# Patient Record
Sex: Female | Born: 1990 | Race: White | Hispanic: No | Marital: Married | Smoking: Current every day smoker
Health system: Southern US, Community
[De-identification: ages and names within clinical notes are randomized; demographics above are authoritative.]

## PROBLEM LIST (undated history)

## (undated) ENCOUNTER — Inpatient Hospital Stay (HOSPITAL_COMMUNITY): Payer: Self-pay

## (undated) DIAGNOSIS — F4310 Post-traumatic stress disorder, unspecified: Secondary | ICD-10-CM

## (undated) DIAGNOSIS — N926 Irregular menstruation, unspecified: Secondary | ICD-10-CM

## (undated) DIAGNOSIS — N289 Disorder of kidney and ureter, unspecified: Secondary | ICD-10-CM

## (undated) DIAGNOSIS — F329 Major depressive disorder, single episode, unspecified: Secondary | ICD-10-CM

## (undated) DIAGNOSIS — Z8619 Personal history of other infectious and parasitic diseases: Secondary | ICD-10-CM

## (undated) DIAGNOSIS — R569 Unspecified convulsions: Secondary | ICD-10-CM

## (undated) DIAGNOSIS — Q627 Congenital vesico-uretero-renal reflux: Secondary | ICD-10-CM

## (undated) DIAGNOSIS — N390 Urinary tract infection, site not specified: Secondary | ICD-10-CM

## (undated) DIAGNOSIS — F32A Depression, unspecified: Secondary | ICD-10-CM

## (undated) DIAGNOSIS — F909 Attention-deficit hyperactivity disorder, unspecified type: Secondary | ICD-10-CM

## (undated) DIAGNOSIS — R480 Dyslexia and alexia: Secondary | ICD-10-CM

## (undated) DIAGNOSIS — F319 Bipolar disorder, unspecified: Secondary | ICD-10-CM

## (undated) DIAGNOSIS — O99345 Other mental disorders complicating the puerperium: Secondary | ICD-10-CM

## (undated) DIAGNOSIS — F53 Postpartum depression: Secondary | ICD-10-CM

## (undated) DIAGNOSIS — Z975 Presence of (intrauterine) contraceptive device: Secondary | ICD-10-CM

## (undated) DIAGNOSIS — IMO0002 Reserved for concepts with insufficient information to code with codable children: Secondary | ICD-10-CM

## (undated) HISTORY — DX: Presence of (intrauterine) contraceptive device: Z97.5

## (undated) HISTORY — PX: OTHER SURGICAL HISTORY: SHX169

## (undated) HISTORY — DX: Unspecified convulsions: R56.9

## (undated) HISTORY — DX: Dyslexia and alexia: R48.0

## (undated) HISTORY — DX: Attention-deficit hyperactivity disorder, unspecified type: F90.9

## (undated) HISTORY — PX: KIDNEY SURGERY: SHX687

## (undated) HISTORY — DX: Personal history of other infectious and parasitic diseases: Z86.19

## (undated) HISTORY — DX: Irregular menstruation, unspecified: N92.6

## (undated) HISTORY — PX: SINUS SURGERY WITH INSTATRAK: SHX5215

## (undated) HISTORY — DX: Reserved for concepts with insufficient information to code with codable children: IMO0002

## (undated) HISTORY — DX: Bipolar disorder, unspecified: F31.9

## (undated) HISTORY — DX: Postpartum depression: F53.0

## (undated) HISTORY — DX: Other mental disorders complicating the puerperium: O99.345

---
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
## 2012-02-17 LAB — OB RESULTS CONSOLE RPR: RPR: NONREACTIVE

## 2012-02-17 LAB — OB RESULTS CONSOLE HEPATITIS B SURFACE ANTIGEN: Hepatitis B Surface Ag: NEGATIVE

## 2012-02-17 LAB — OB RESULTS CONSOLE ABO/RH: RH Type: POSITIVE

## 2012-04-13 NOTE — L&D Delivery Note (Signed)
Delivery Note At 12:10 PM a viable female was delivered via Vaginal, Spontaneous Delivery (Presentation: Left Occiput Posterior).  APGAR: , ; weight .   Placenta status: Intact, Spontaneous.  Cord: 3 vessels with the following complications: None.   Anesthesia: Epidural  Episiotomy: None Lacerations: 1st degree Suture Repair: 3.0 vicryl rapide Est. Blood Loss (mL): 400  Mom to postpartum.  Baby to nursery-stable.  HARRAWAY-SMITH, Breasia Karges 08/26/2012, 12:23 PM

## 2012-06-02 LAB — OB RESULTS CONSOLE HIV ANTIBODY (ROUTINE TESTING): HIV: NONREACTIVE

## 2012-07-12 ENCOUNTER — Encounter: Payer: Self-pay | Admitting: *Deleted

## 2012-07-13 ENCOUNTER — Ambulatory Visit (INDEPENDENT_AMBULATORY_CARE_PROVIDER_SITE_OTHER): Payer: Medicaid Other | Admitting: Obstetrics and Gynecology

## 2012-07-13 ENCOUNTER — Encounter: Payer: Self-pay | Admitting: Obstetrics and Gynecology

## 2012-07-13 VITALS — BP 100/60 | Wt 246.0 lb

## 2012-07-13 DIAGNOSIS — O09299 Supervision of pregnancy with other poor reproductive or obstetric history, unspecified trimester: Secondary | ICD-10-CM

## 2012-07-13 DIAGNOSIS — Z3490 Encounter for supervision of normal pregnancy, unspecified, unspecified trimester: Secondary | ICD-10-CM | POA: Insufficient documentation

## 2012-07-13 DIAGNOSIS — F1920 Other psychoactive substance dependence, uncomplicated: Secondary | ICD-10-CM

## 2012-07-13 DIAGNOSIS — O99340 Other mental disorders complicating pregnancy, unspecified trimester: Secondary | ICD-10-CM

## 2012-07-13 DIAGNOSIS — O99330 Smoking (tobacco) complicating pregnancy, unspecified trimester: Secondary | ICD-10-CM

## 2012-07-13 DIAGNOSIS — Z3483 Encounter for supervision of other normal pregnancy, third trimester: Secondary | ICD-10-CM

## 2012-07-13 DIAGNOSIS — Z1389 Encounter for screening for other disorder: Secondary | ICD-10-CM

## 2012-07-13 DIAGNOSIS — Z3493 Encounter for supervision of normal pregnancy, unspecified, third trimester: Secondary | ICD-10-CM

## 2012-07-13 DIAGNOSIS — Z331 Pregnant state, incidental: Secondary | ICD-10-CM

## 2012-07-13 LAB — POCT URINALYSIS DIPSTICK
Blood, UA: NEGATIVE
Glucose, UA: NEGATIVE
Ketones, UA: NEGATIVE
Nitrite, UA: NEGATIVE

## 2012-07-13 NOTE — Progress Notes (Signed)
Vaginal moisture, not continuous. No suspicion of SROM

## 2012-07-13 NOTE — Progress Notes (Signed)
Pressure with urination. Braxton Hick's contractions.

## 2012-07-26 ENCOUNTER — Telehealth: Payer: Self-pay | Admitting: *Deleted

## 2012-07-26 NOTE — Telephone Encounter (Signed)
Spoke with pt. Preparation H is safe to use for hemorrhoids. Advised if she don't see an improvement after using for several days, to call us back. Pt verbalized understanding.

## 2012-07-27 ENCOUNTER — Encounter: Payer: Medicaid Other | Admitting: Advanced Practice Midwife

## 2012-08-04 ENCOUNTER — Encounter: Payer: Medicaid Other | Admitting: Advanced Practice Midwife

## 2012-08-04 ENCOUNTER — Encounter (HOSPITAL_COMMUNITY): Payer: Self-pay

## 2012-08-04 ENCOUNTER — Encounter: Payer: Self-pay | Admitting: Advanced Practice Midwife

## 2012-08-04 ENCOUNTER — Ambulatory Visit (INDEPENDENT_AMBULATORY_CARE_PROVIDER_SITE_OTHER): Payer: Medicaid Other | Admitting: Advanced Practice Midwife

## 2012-08-04 VITALS — BP 120/50 | Wt 251.4 lb

## 2012-08-04 DIAGNOSIS — Z3483 Encounter for supervision of other normal pregnancy, third trimester: Secondary | ICD-10-CM

## 2012-08-04 DIAGNOSIS — Z1389 Encounter for screening for other disorder: Secondary | ICD-10-CM

## 2012-08-04 DIAGNOSIS — O99330 Smoking (tobacco) complicating pregnancy, unspecified trimester: Secondary | ICD-10-CM

## 2012-08-04 DIAGNOSIS — Z3492 Encounter for supervision of normal pregnancy, unspecified, second trimester: Secondary | ICD-10-CM

## 2012-08-04 DIAGNOSIS — O99320 Drug use complicating pregnancy, unspecified trimester: Secondary | ICD-10-CM

## 2012-08-04 DIAGNOSIS — Z331 Pregnant state, incidental: Secondary | ICD-10-CM

## 2012-08-04 DIAGNOSIS — O09299 Supervision of pregnancy with other poor reproductive or obstetric history, unspecified trimester: Secondary | ICD-10-CM

## 2012-08-04 DIAGNOSIS — O99340 Other mental disorders complicating pregnancy, unspecified trimester: Secondary | ICD-10-CM

## 2012-08-04 LAB — OB RESULTS CONSOLE GC/CHLAMYDIA
Chlamydia: NEGATIVE
Gonorrhea: NEGATIVE

## 2012-08-04 LAB — POCT URINALYSIS DIPSTICK
Blood, UA: NEGATIVE
Glucose, UA: NEGATIVE
Ketones, UA: NEGATIVE
Nitrite, UA: NEGATIVE
Protein, UA: NEGATIVE

## 2012-08-04 NOTE — Assessment & Plan Note (Addendum)
Clinic:[facility]  Genetic Screen NT:                 normal  Anatomic [place] normal  Glucose Screen 75/150/114  GBS   Feeding Preference bottle  Contraception Nexplanon  Circumcision

## 2012-08-04 NOTE — Progress Notes (Signed)
No c/o at this time except musculoskeletal.  Comfort measures given.  GBS collected today. Routine questions about pregnancy answered.  F/U in 1 weeks for LROB.

## 2012-08-04 NOTE — Patient Instructions (Signed)
Round Ligament Pain The round ligament is made up of muscle and fibrous tissue. It is attached to the uterus near the fallopian tube. The round ligament is located on both sides of the uterus and helps support the position of the uterus. It usually begins in the second trimester of pregnancy when the uterus comes out of the pelvis. The pain can come and go until the baby is delivered. Round ligament pain is not a serious problem and does not cause harm to the baby. CAUSE During pregnancy the uterus grows the most from the second trimester to delivery. As it grows, it stretches and slightly twists the round ligaments. When the uterus leans from one side to the other, the round ligament on the opposite side pulls and stretches. This can cause pain. SYMPTOMS  Pain can occur on one side or both sides. The pain is usually a short, sharp, and pinching-like. Sometimes it can be a dull, lingering and aching pain. The pain is located in the lower side of the abdomen or in the groin. The pain is internal and usually starts deep in the groin and moves up to the outside of the hip area. Pain can occur with:  Sudden change in position like getting out of bed or a chair.  Rolling over in bed.  Coughing or sneezing.  Walking too much.  Any type of physical activity. DIAGNOSIS  Your caregiver will make sure there are no serious problems causing the pain. When nothing serious is found, the symptoms usually indicate that the pain is from the round ligament. TREATMENT   Sit down and relax when the pain starts.  Flex your knees up to your belly.  Lay on your side with a pillow under your belly (abdomen) and another one between your legs.  Sit in a hot bath for 15 to 20 minutes or until the pain goes away. HOME CARE INSTRUCTIONS   Only take over-the-counter or prescriptions medicines for pain, discomfort or fever as directed by your caregiver.  Sit and stand slowly.  Avoid long walks if it causes  pain.  Stop or lessen your physical activities if it causes pain. SEEK MEDICAL CARE IF:   The pain does not go away with any of your treatment.  You need stronger medication for the pain.  You develop back pain that you did not have before with the side pain. SEEK IMMEDIATE MEDICAL CARE IF:   You develop a temperature of 102 F (38.9 C) or higher.  You develop uterine contractions.  You develop vaginal bleeding.  You develop nausea, vomiting or diarrhea.  You develop chills.  You have pain when you urinate. Document Released: 01/07/2008 Document Revised: 06/22/2011 Document Reviewed: 01/07/2008 [facility] Patient Information 2013 [place], [place]. Back Pain in Pregnancy Back pain during pregnancy is common. It happens in about half of all pregnancies. It is important for you and your baby that you remain active during your pregnancy.If you feel that back pain is not allowing you to remain active or sleep well, it is time to see your caregiver. Back pain may be caused by several factors related to changes during your pregnancy.Fortunately, unless you had trouble with your back before your pregnancy, the pain is likely to get better after you deliver. Low back pain usually occurs between the fifth and seventh months of pregnancy. It can, however, happen in the first couple months. Factors that increase the risk of back problems include:   Previous back problems.  Injury to your  back.  Having twins or multiple births.  A chronic cough.  Stress.  Job-related repetitive motions.  Muscle or spinal disease in the back.  Family history of back problems, ruptured (herniated) discs, or osteoporosis.  Depression, anxiety, and panic attacks. CAUSES   When you are pregnant, your body produces a hormone called relaxin. This hormonemakes the ligaments connecting the low back and pubic bones more flexible. This flexibility allows the baby to be delivered more easily. When your  ligaments are loose, your muscles need to work harder to support your back. Soreness in your back can come from tired muscles. Soreness can also come from back tissues that are irritated since they are receiving less support.  As the baby grows, it puts pressure on the nerves and blood vessels in your pelvis. This can cause back pain.  As the baby grows and gets heavier during pregnancy, the uterus pushes the stomach muscles forward and changes your center of gravity. This makes your back muscles work harder to maintain good posture. SYMPTOMS  Lumbar pain during pregnancy Lumbar pain during pregnancy usually occurs at or above the waist in the center of the back. There may be pain and numbness that radiates into your leg or foot. This is similar to low back pain experienced by non-pregnant women. It usually increases with sitting for long periods of time, standing, or repetitive lifting. Tenderness may also be present in the muscles along your upper back. Posterior pelvic pain during pregnancy Pain in the back of the pelvis is more common than lumbar pain in pregnancy. It is a deep pain felt in your side at the waistline, or across the tailbone (sacrum), or in both places. You may have pain on one or both sides. This pain can also go into the buttocks and backs of the upper thighs. Pubic and groin pain may also be present. The pain does not quickly resolve with rest, and morning stiffness may also be present. Pelvic pain during pregnancy can be brought on by most activities. A high level of fitness before and during pregnancy may or may not prevent this problem. Labor pain is usually 1 to 2 minutes apart, lasts for about 1 minute, and involves a bearing down feeling or pressure in your pelvis. However, if you are at term with the pregnancy, constant low back pain can be the beginning of early labor, and you should be aware of this. DIAGNOSIS  X-rays of the back should not be done during the first 12 to  14 weeks of the pregnancy and only when absolutely necessary during the rest of the pregnancy. MRIs do not give off radiation and are safe during pregnancy. MRIs also should only be done when absolutely necessary. HOME CARE INSTRUCTIONS  Exercise as directed by your caregiver. Exercise is the most effective way to prevent or manage back pain. If you have a back problem, it is especially important to avoid sports that require sudden body movements. Swimming and walking are great activities.  Do not stand in one place for long periods of time.  Do not wear high heels.  Sit in chairs with good posture. Use a pillow on your lower back if necessary. Make sure your head rests over your shoulders and is not hanging forward.  Try sleeping on your side, preferably the left side, with a pillow or two between your legs. If you are sore after a night's rest, your bedmay betoo soft.Try placing a board between your mattress and box spring.  Listen to your body when lifting.If you are experiencing pain, ask for help or try bending yourknees more so you can use your leg muscles rather than your back muscles. Squat down when picking up something from the floor. Do not bend over.  Eat a healthy diet. Try to gain weight within your caregiver's recommendations.  Use heat or cold packs 3 to 4 times a day for 15 minutes to help with the pain.  Only take over-the-counter or prescription medicines for pain, discomfort, or fever as directed by your caregiver. Sudden (acute) back pain  Use bed rest for only the most extreme, acute episodes of back pain. Prolonged bed rest over 48 hours will aggravate your condition.  Ice is very effective for acute conditions.  Put ice in a plastic bag.  Place a towel between your skin and the bag.  Leave the ice on for 10 to 20 minutes every 2 hours, or as needed.  Using heat packs for 30 minutes prior to activities is also helpful. Continued back pain See your  caregiver if you have continued problems. Your caregiver can help or refer you for appropriate physical therapy. With conditioning, most back problems can be avoided. Sometimes, a more serious issue may be the cause of back pain. You should be seen right away if new problems seem to be developing. Your caregiver may recommend:  A maternity girdle.  An elastic sling.  A back brace.  A massage therapist or acupuncture. SEEK MEDICAL CARE IF:   You are not able to do most of your daily activities, even when taking the pain medicine you were given.  You need a referral to a physical therapist or chiropractor.  You want to try acupuncture. SEEK IMMEDIATE MEDICAL CARE IF:  You develop numbness, tingling, weakness, or problems with the use of your arms or legs.  You develop severe back pain that is no longer relieved with medicines.  You have a sudden change in bowel or bladder control.  You have increasing pain in other areas of the body.  You develop shortness of breath, dizziness, or fainting.  You develop nausea, vomiting, or sweating.  You have back pain which is similar to labor pains.  You have back pain along with your water breaking or vaginal bleeding.  You have back pain or numbness that travels down your leg.  Your back pain developed after you fell.  You develop pain on one side of your back. You may have a kidney stone.  You see blood in your urine. You may have a bladder infection or kidney stone.  You have back pain with blisters. You may have shingles. Back pain is fairly common during pregnancy but should not be accepted as just part of the process. Back pain should always be treated as soon as possible. This will make your pregnancy as pleasant as possible. Document Released: 07/08/2005 Document Revised: 06/22/2011 Document Reviewed: 08/19/2010 [facility] Patient Information 2013 [place], [place].

## 2012-08-04 NOTE — Progress Notes (Signed)
Pain in middle of back. Hips hurt.

## 2012-08-07 LAB — CULTURE, BETA STREP (GROUP B ONLY)

## 2012-08-11 ENCOUNTER — Ambulatory Visit (INDEPENDENT_AMBULATORY_CARE_PROVIDER_SITE_OTHER): Payer: Medicaid Other | Admitting: Obstetrics and Gynecology

## 2012-08-11 ENCOUNTER — Encounter: Payer: Self-pay | Admitting: Obstetrics and Gynecology

## 2012-08-11 VITALS — BP 130/74 | Wt 256.6 lb

## 2012-08-11 DIAGNOSIS — O99320 Drug use complicating pregnancy, unspecified trimester: Secondary | ICD-10-CM

## 2012-08-11 DIAGNOSIS — O09299 Supervision of pregnancy with other poor reproductive or obstetric history, unspecified trimester: Secondary | ICD-10-CM

## 2012-08-11 DIAGNOSIS — O99330 Smoking (tobacco) complicating pregnancy, unspecified trimester: Secondary | ICD-10-CM

## 2012-08-11 DIAGNOSIS — Z1389 Encounter for screening for other disorder: Secondary | ICD-10-CM

## 2012-08-11 DIAGNOSIS — Z331 Pregnant state, incidental: Secondary | ICD-10-CM

## 2012-08-11 DIAGNOSIS — O99340 Other mental disorders complicating pregnancy, unspecified trimester: Secondary | ICD-10-CM

## 2012-08-11 LAB — POCT URINALYSIS DIPSTICK
Blood, UA: NEGATIVE
Glucose, UA: NEGATIVE
Ketones, UA: NEGATIVE

## 2012-08-11 NOTE — Progress Notes (Signed)
38w1, no c/o, has transportation.  Discussed hygeine, clothing.

## 2012-08-11 NOTE — Patient Instructions (Addendum)
Fetal Movement Counts Patient Name: __________________________________________________ Patient Due Date: ____________________ Kick counts is highly recommended in high risk pregnancies, but it is a good idea for every pregnant woman to do. Start counting fetal movements at 28 weeks of the pregnancy. Fetal movements increase after eating a full meal or eating or drinking something sweet (the blood sugar is higher). It is also important to drink plenty of fluids (well hydrated) before doing the count. Lie on your left side because it helps with the circulation or you can sit in a comfortable chair with your arms over your belly (abdomen) with no distractions around you. DOING THE COUNT  Try to do the count the same time of day each time you do it.  Mark the day and time, then see how long it takes for you to feel 10 movements (kicks, flutters, swishes, rolls). You should have at least 10 movements within 2 hours. You will most likely feel 10 movements in much less than 2 hours. If you do not, wait an hour and count again. After a couple of days you will see a pattern.  What you are looking for is a change in the pattern or not enough counts in 2 hours. Is it taking longer in time to reach 10 movements? SEEK MEDICAL CARE IF:  You feel less than 10 counts in 2 hours. Tried twice.  No movement in one hour.  The pattern is changing or taking longer each day to reach 10 counts in 2 hours.  You feel the baby is not moving as it usually does. Date: ____________ Movements: ____________ Start time: ____________ Finish time: ____________  Date: ____________ Movements: ____________ Start time: ____________ Finish time: ____________ Date: ____________ Movements: ____________ Start time: ____________ Finish time: ____________ Date: ____________ Movements: ____________ Start time: ____________ Finish time: ____________ Date: ____________ Movements: ____________ Start time: ____________ Finish time:  ____________ Date: ____________ Movements: ____________ Start time: ____________ Finish time: ____________ Date: ____________ Movements: ____________ Start time: ____________ Finish time: ____________ Date: ____________ Movements: ____________ Start time: ____________ Finish time: ____________  Date: ____________ Movements: ____________ Start time: ____________ Finish time: ____________ Date: ____________ Movements: ____________ Start time: ____________ Finish time: ____________ Date: ____________ Movements: ____________ Start time: ____________ Finish time: ____________ Date: ____________ Movements: ____________ Start time: ____________ Finish time: ____________ Date: ____________ Movements: ____________ Start time: ____________ Finish time: ____________ Date: ____________ Movements: ____________ Start time: ____________ Finish time: ____________ Date: ____________ Movements: ____________ Start time: ____________ Finish time: ____________  Date: ____________ Movements: ____________ Start time: ____________ Finish time: ____________ Date: ____________ Movements: ____________ Start time: ____________ Finish time: ____________ Date: ____________ Movements: ____________ Start time: ____________ Finish time: ____________ Date: ____________ Movements: ____________ Start time: ____________ Finish time: ____________ Date: ____________ Movements: ____________ Start time: ____________ Finish time: ____________ Date: ____________ Movements: ____________ Start time: ____________ Finish time: ____________ Date: ____________ Movements: ____________ Start time: ____________ Finish time: ____________  Date: ____________ Movements: ____________ Start time: ____________ Finish time: ____________ Date: ____________ Movements: ____________ Start time: ____________ Finish time: ____________ Date: ____________ Movements: ____________ Start time: ____________ Finish time: ____________ Date: ____________ Movements:  ____________ Start time: ____________ Finish time: ____________ Date: ____________ Movements: ____________ Start time: ____________ Finish time: ____________ Date: ____________ Movements: ____________ Start time: ____________ Finish time: ____________ Date: ____________ Movements: ____________ Start time: ____________ Finish time: ____________  Date: ____________ Movements: ____________ Start time: ____________ Finish time: ____________ Date: ____________ Movements: ____________ Start time: ____________ Finish time: ____________ Date: ____________ Movements: ____________ Start time: ____________ Finish time: ____________ Date: ____________ Movements:   ____________ Start time: ____________ Finish time: ____________ Date: ____________ Movements: ____________ Start time: ____________ Finish time: ____________ Date: ____________ Movements: ____________ Start time: ____________ Finish time: ____________ Date: ____________ Movements: ____________ Start time: ____________ Finish time: ____________  Date: ____________ Movements: ____________ Start time: ____________ Finish time: ____________ Date: ____________ Movements: ____________ Start time: ____________ Finish time: ____________ Date: ____________ Movements: ____________ Start time: ____________ Finish time: ____________ Date: ____________ Movements: ____________ Start time: ____________ Finish time: ____________ Date: ____________ Movements: ____________ Start time: ____________ Finish time: ____________ Date: ____________ Movements: ____________ Start time: ____________ Finish time: ____________ Date: ____________ Movements: ____________ Start time: ____________ Finish time: ____________  Date: ____________ Movements: ____________ Start time: ____________ Finish time: ____________ Date: ____________ Movements: ____________ Start time: ____________ Finish time: ____________ Date: ____________ Movements: ____________ Start time: ____________ Finish  time: ____________ Date: ____________ Movements: ____________ Start time: ____________ Finish time: ____________ Date: ____________ Movements: ____________ Start time: ____________ Finish time: ____________ Date: ____________ Movements: ____________ Start time: ____________ Finish time: ____________ Date: ____________ Movements: ____________ Start time: ____________ Finish time: ____________  Date: ____________ Movements: ____________ Start time: ____________ Finish time: ____________ Date: ____________ Movements: ____________ Start time: ____________ Finish time: ____________ Date: ____________ Movements: ____________ Start time: ____________ Finish time: ____________ Date: ____________ Movements: ____________ Start time: ____________ Finish time: ____________ Date: ____________ Movements: ____________ Start time: ____________ Finish time: ____________ Date: ____________ Movements: ____________ Start time: ____________ Finish time: ____________ Document Released: 04/29/2006 Document Revised: 06/22/2011 Document Reviewed: 10/30/2008 [employer] Patient Information 2013 [employer].  

## 2012-08-18 ENCOUNTER — Encounter: Payer: Self-pay | Admitting: Obstetrics & Gynecology

## 2012-08-18 ENCOUNTER — Ambulatory Visit (INDEPENDENT_AMBULATORY_CARE_PROVIDER_SITE_OTHER): Payer: Medicaid Other | Admitting: Obstetrics & Gynecology

## 2012-08-18 VITALS — BP 110/58 | Wt 257.0 lb

## 2012-08-18 DIAGNOSIS — O99330 Smoking (tobacco) complicating pregnancy, unspecified trimester: Secondary | ICD-10-CM

## 2012-08-18 DIAGNOSIS — O99320 Drug use complicating pregnancy, unspecified trimester: Secondary | ICD-10-CM

## 2012-08-18 DIAGNOSIS — Z331 Pregnant state, incidental: Secondary | ICD-10-CM

## 2012-08-18 DIAGNOSIS — O99340 Other mental disorders complicating pregnancy, unspecified trimester: Secondary | ICD-10-CM

## 2012-08-18 DIAGNOSIS — Z1389 Encounter for screening for other disorder: Secondary | ICD-10-CM

## 2012-08-18 DIAGNOSIS — O09299 Supervision of pregnancy with other poor reproductive or obstetric history, unspecified trimester: Secondary | ICD-10-CM

## 2012-08-18 LAB — POCT URINALYSIS DIPSTICK
Glucose, UA: NEGATIVE
Ketones, UA: NEGATIVE

## 2012-08-18 NOTE — Progress Notes (Signed)
BP weight and urine results all reviewed and noted. Patient reports good fetal movement, denies any bleeding and no rupture of membranes symptoms or regular contractions. Patient is without complaints. All questions were answered.  

## 2012-08-18 NOTE — Patient Instructions (Signed)
Breastfeeding Deciding to breastfeed is one of the best choices you can make for you and your baby. The information that follows gives a brief overview of the benefits of breastfeeding as well as common topics surrounding breastfeeding. BENEFITS OF BREASTFEEDING For the baby  The first milk (colostrum) helps the baby's digestive system function better.   There are antibodies in the mother's milk that help the baby fight off infections.   The baby has a lower incidence of asthma, allergies, and sudden infant death syndrome (SIDS).   The nutrients in breast milk are better for the baby than infant formulas, and breast milk helps the baby's brain grow better.   Babies who breastfeed have less gas, colic, and constipation.  For the mother  Breastfeeding helps develop a very special bond between the mother and her baby.   Breastfeeding is convenient, always available at the correct temperature, and costs nothing.   Breastfeeding burns calories in the mother and helps her lose weight that was gained during pregnancy.   Breastfeeding makes the uterus contract back down to normal size faster and slows bleeding following delivery.   Breastfeeding mothers have a lower risk of developing breast cancer.  BREASTFEEDING FREQUENCY  A healthy, full-term baby may breastfeed as often as every hour or space his or her feedings to every 3 hours.   Watch your baby for signs of hunger. Nurse your baby if he or she shows signs of hunger. How often you nurse will vary from baby to baby.   Nurse as often as the baby requests, or when you feel the need to reduce the fullness of your breasts.   Awaken the baby if it has been 3 4 hours since the last feeding.   Frequent feeding will help the mother make more milk and will help prevent problems, such as sore nipples and engorgement of the breasts.  BABY'S POSITION AT THE BREAST  Whether lying down or sitting, be sure that the baby's tummy is  facing your tummy.   Support the breast with 4 fingers underneath the breast and the thumb above. Make sure your fingers are well away from the nipple and baby's mouth.   Stroke the baby's lips gently with your finger or nipple.   When the baby's mouth is open wide enough, place all of your nipple and as much of the areola as possible into your baby's mouth.   Pull the baby in close so the tip of the nose and the baby's cheeks touch the breast during the feeding.  FEEDINGS AND SUCTION  The length of each feeding varies from baby to baby and from feeding to feeding.   The baby must suck about 2 3 minutes for your milk to get to him or her. This is called a "let down." For this reason, allow the baby to feed on each breast as long as he or she wants. Your baby will end the feeding when he or she has received the right balance of nutrients.   To break the suction, put your finger into the corner of the baby's mouth and slide it between his or her gums before removing your breast from his or her mouth. This will help prevent sore nipples.  HOW TO TELL WHETHER YOUR BABY IS GETTING ENOUGH BREAST MILK. Wondering whether or not your baby is getting enough milk is a common concern among mothers. You can be assured that your baby is getting enough milk if:   Your baby is actively   sucking and you hear swallowing.   Your baby seems relaxed and satisfied after a feeding.   Your baby nurses at least 8 12 times in a 24 hour time period. Nurse your baby until he or she unlatches or falls asleep at the first breast (at least 10 20 minutes), then offer the second side.   Your baby is wetting 5 6 disposable diapers (6 8 cloth diapers) in a 24 hour period by 5 6 days of age.   Your baby is having at least 3 4 stools every 24 hours for the first 6 weeks. The stool should be soft and yellow.   Your baby should gain 4 7 ounces per week after he or she is 4 days old.   Your breasts feel softer  after nursing.  REDUCING BREAST ENGORGEMENT  In the first week after your baby is born, you may experience signs of breast engorgement. When breasts are engorged, they feel heavy, warm, full, and may be tender to the touch. You can reduce engorgement if you:   Nurse frequently, every 2 3 hours. Mothers who breastfeed early and often have fewer problems with engorgement.   Place light ice packs on your breasts for 10 20 minutes between feedings. This reduces swelling. Wrap the ice packs in a lightweight towel to protect your skin. Bags of frozen vegetables work well for this purpose.   Take a warm shower or apply warm, moist heat to your breast for 5 10 minutes just before each feeding. This increases circulation and helps the milk flow.   Gently massage your breast before and during the feeding. Using your finger tips, massage from the chest wall towards your nipple in a circular motion.   Make sure that the baby empties at least one breast at every feeding before switching sides.   Use a breast pump to empty the breasts if your baby is sleepy or not nursing well. You may also want to pump if you are returning to work oryou feel you are getting engorged.   Avoid bottle feeds, pacifiers, or supplemental feedings of water or juice in place of breastfeeding. Breast milk is all the food your baby needs. It is not necessary for your baby to have water or formula. In fact, to help your breasts make more milk, it is best not to give your baby supplemental feedings during the early weeks.   Be sure the baby is latched on and positioned properly while breastfeeding.   Wear a supportive bra, avoiding underwire styles.   Eat a balanced diet with enough fluids.   Rest often, relax, and take your prenatal vitamins to prevent fatigue, stress, and anemia.  If you follow these suggestions, your engorgement should improve in 24 48 hours. If you are still experiencing difficulty, call your  lactation consultant or caregiver.  CARING FOR YOURSELF Take care of your breasts  Bathe or shower daily.   Avoid using soap on your nipples.   Start feedings on your left breast at one feeding and on your right breast at the next feeding.   You will notice an increase in your milk supply 2 5 days after delivery. You may feel some discomfort from engorgement, which makes your breasts very firm and often tender. Engorgement "peaks" out within 24 48 hours. In the meantime, apply warm moist towels to your breasts for 5 10 minutes before feeding. Gentle massage and expression of some milk before feeding will soften your breasts, making it easier for your   baby to latch on.   Wear a well-fitting nursing bra, and air dry your nipples for a 3 4minutes after each feeding.   Only use cotton bra pads.   Only use pure lanolin on your nipples after nursing. You do not need to wash it off before feeding the baby again. Another option is to express a few drops of breast milk and gently massage it into your nipples.  Take care of yourself  Eat well-balanced meals and nutritious snacks.   Drinking milk, fruit juice, and water to satisfy your thirst (about 8 glasses a day).   Get plenty of rest.  Avoid foods that you notice affect the baby in a bad way.  SEEK MEDICAL CARE IF:   You have difficulty with breastfeeding and need help.   You have a hard, red, sore area on your breast that is accompanied by a fever.   Your baby is too sleepy to eat well or is having trouble sleeping.   Your baby is wetting less than 6 diapers a day, by 5 days of age.   Your baby's skin or white part of his or her eyes is more yellow than it was in the hospital.   You feel depressed.  Document Released: 03/30/2005 Document Revised: 09/29/2011 Document Reviewed: 06/28/2011 Exit[employer] Patient Information 2013 [employer].  

## 2012-08-24 ENCOUNTER — Inpatient Hospital Stay (HOSPITAL_COMMUNITY)
Admission: AD | Admit: 2012-08-24 | Discharge: 2012-08-24 | Disposition: A | Discharge status: Home / Self Care | Payer: Medicaid Other | Source: Ambulatory Visit | Attending: Obstetrics & Gynecology | Admitting: Obstetrics & Gynecology

## 2012-08-24 ENCOUNTER — Encounter (HOSPITAL_COMMUNITY): Payer: Self-pay | Admitting: *Deleted

## 2012-08-24 DIAGNOSIS — O479 False labor, unspecified: Secondary | ICD-10-CM | POA: Insufficient documentation

## 2012-08-24 DIAGNOSIS — M549 Dorsalgia, unspecified: Secondary | ICD-10-CM | POA: Insufficient documentation

## 2012-08-24 LAB — URINALYSIS, ROUTINE W REFLEX MICROSCOPIC
Glucose, UA: NEGATIVE mg/dL
Nitrite: NEGATIVE
Specific Gravity, Urine: >1.03 — ABNORMAL HIGH (ref 1.005–1.030)
pH: 6.5 (ref 5.0–8.0)

## 2012-08-24 LAB — URINE MICROSCOPIC-ADD ON

## 2012-08-24 NOTE — MAU Note (Addendum)
Pt. Arrived via ambulance due to contractions/back pain that she has been having for two days. Denies any leakage of fluid, blood and mucous. Pt.states baby has been moving less today per pt. Patient sees [facility] in [place]. Did have last appointment last Thursday and she was 1cm at that time and baby was head down per pt.

## 2012-08-25 ENCOUNTER — Encounter: Payer: Self-pay | Admitting: Advanced Practice Midwife

## 2012-08-25 ENCOUNTER — Ambulatory Visit (INDEPENDENT_AMBULATORY_CARE_PROVIDER_SITE_OTHER): Payer: Medicaid Other | Admitting: Advanced Practice Midwife

## 2012-08-25 ENCOUNTER — Inpatient Hospital Stay (HOSPITAL_COMMUNITY)
Admission: AD | Admit: 2012-08-25 | Discharge: 2012-08-28 | DRG: 775 | Disposition: A | Discharge status: Home / Self Care | Payer: Medicaid Other | Source: Ambulatory Visit | Attending: Obstetrics and Gynecology | Admitting: Obstetrics and Gynecology

## 2012-08-25 VITALS — BP 116/78 | Wt 259.5 lb

## 2012-08-25 DIAGNOSIS — F1920 Other psychoactive substance dependence, uncomplicated: Secondary | ICD-10-CM

## 2012-08-25 DIAGNOSIS — Z3492 Encounter for supervision of normal pregnancy, unspecified, second trimester: Secondary | ICD-10-CM

## 2012-08-25 DIAGNOSIS — Z331 Pregnant state, incidental: Secondary | ICD-10-CM

## 2012-08-25 DIAGNOSIS — O4290 Premature rupture of membranes, unspecified as to length of time between rupture and onset of labor, unspecified weeks of gestation: Secondary | ICD-10-CM | POA: Diagnosis present

## 2012-08-25 DIAGNOSIS — O99320 Drug use complicating pregnancy, unspecified trimester: Secondary | ICD-10-CM

## 2012-08-25 DIAGNOSIS — O99340 Other mental disorders complicating pregnancy, unspecified trimester: Secondary | ICD-10-CM

## 2012-08-25 DIAGNOSIS — O09299 Supervision of pregnancy with other poor reproductive or obstetric history, unspecified trimester: Secondary | ICD-10-CM

## 2012-08-25 DIAGNOSIS — O99330 Smoking (tobacco) complicating pregnancy, unspecified trimester: Secondary | ICD-10-CM

## 2012-08-25 DIAGNOSIS — Z1389 Encounter for screening for other disorder: Secondary | ICD-10-CM

## 2012-08-25 LAB — POCT URINALYSIS DIPSTICK
Ketones, UA: NEGATIVE
Leukocytes, UA: NEGATIVE
Nitrite, UA: NEGATIVE
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
## 2012-08-25 NOTE — Progress Notes (Signed)
No c/o at this time  Still has mucus discharge.  Routine questions about pregnancy answered. IOL scheduled for 5/21 at 0700 (fast labor with first).

## 2012-08-25 NOTE — MAU Note (Signed)
Pt states she had a gush of clear fluid at 2245-states contiues to leak clear fluid

## 2012-08-25 NOTE — Patient Instructions (Signed)
Induction of labor scheduled for Wednesday, May 21 at 7:00am.  Go to Maternity Admissions Unit and let them know you are for induction. Good luck!

## 2012-08-25 NOTE — Progress Notes (Signed)
Was carried by ambulance yest to [facility] with contractions. None today. Lost some of mucus plug today.

## 2012-08-26 ENCOUNTER — Encounter (HOSPITAL_COMMUNITY): Payer: Self-pay | Admitting: Anesthesiology

## 2012-08-26 ENCOUNTER — Encounter (HOSPITAL_COMMUNITY): Payer: Self-pay | Admitting: *Deleted

## 2012-08-26 ENCOUNTER — Inpatient Hospital Stay (HOSPITAL_COMMUNITY): Payer: Medicaid Other | Admitting: Anesthesiology

## 2012-08-26 LAB — TYPE AND SCREEN: Antibody Screen: NEGATIVE

## 2012-08-26 LAB — DRUG SCREEN, URINE, NO CONFIRMATION
Amphetamine Screen, Ur: NEGATIVE
Barbiturate Quant, Ur: NEGATIVE
Cocaine Metabolites: NEGATIVE
Methadone: NEGATIVE
Opiate Screen, Urine: NEGATIVE

## 2012-08-26 LAB — OXYCODONE SCREEN, UA, RFLX CONFIRM: Oxycodone Screen, Ur: NEGATIVE ng/mL

## 2012-08-26 LAB — CBC
Hemoglobin: 10.7 g/dL — ABNORMAL LOW (ref 12.0–15.0)
MCH: 29.1 pg (ref 26.0–34.0)
MCV: 86.7 fL (ref 78.0–100.0)
RBC: 3.68 MIL/uL — ABNORMAL LOW (ref 3.87–5.11)
WBC: 12.7 10*3/uL — ABNORMAL HIGH (ref 4.0–10.5)

## 2012-08-26 LAB — RPR: RPR Ser Ql: NONREACTIVE

## 2012-08-26 MED ORDER — ACETAMINOPHEN 500 MG PO TABS
1000.0000 mg | ORAL_TABLET | Freq: Four times a day (QID) | ORAL | Status: DC | PRN
  Administered 2012-08-26 – 2012-08-28 (×6): 1000 mg via ORAL
  Filled 2012-08-26 (×7): qty 2

## 2012-08-26 MED ORDER — IBUPROFEN 600 MG PO TABS
600.0000 mg | ORAL_TABLET | Freq: Four times a day (QID) | ORAL | Status: DC
  Administered 2012-08-26 – 2012-08-28 (×9): 600 mg via ORAL
  Filled 2012-08-26 (×9): qty 1

## 2012-08-26 MED ORDER — OXYTOCIN 40 UNITS IN LACTATED RINGERS INFUSION - SIMPLE MED
62.5000 mL/h | INTRAVENOUS | Status: DC
  Administered 2012-08-26: 62.5 mL/h via INTRAVENOUS

## 2012-08-26 MED ORDER — FENTANYL 2.5 MCG/ML BUPIVACAINE 1/10 % EPIDURAL INFUSION (WH - ANES)
INTRAMUSCULAR | Status: DC | PRN
  Administered 2012-08-26: 14 mL/h via EPIDURAL

## 2012-08-26 MED ORDER — FENTANYL CITRATE 0.05 MG/ML IJ SOLN
50.0000 ug | INTRAMUSCULAR | Status: DC | PRN
  Administered 2012-08-26: 50 ug via INTRAVENOUS
  Filled 2012-08-26: qty 2

## 2012-08-26 MED ORDER — SENNOSIDES-DOCUSATE SODIUM 8.6-50 MG PO TABS
2.0000 | ORAL_TABLET | Freq: Every evening | ORAL | Status: DC | PRN
  Administered 2012-08-26 – 2012-08-27 (×2): 2 via ORAL

## 2012-08-26 MED ORDER — PHENYLEPHRINE 40 MCG/ML (10ML) SYRINGE FOR IV PUSH (FOR BLOOD PRESSURE SUPPORT)
80.0000 ug | PREFILLED_SYRINGE | INTRAVENOUS | Status: DC | PRN
  Filled 2012-08-26: qty 2

## 2012-08-26 MED ORDER — DIBUCAINE 1 % RE OINT
1.0000 "application " | TOPICAL_OINTMENT | RECTAL | Status: DC | PRN
  Administered 2012-08-26: 1 via RECTAL
  Filled 2012-08-26: qty 28

## 2012-08-26 MED ORDER — TERBUTALINE SULFATE 1 MG/ML IJ SOLN: 0.2500 mg | Freq: Once | INTRAMUSCULAR | Status: DC | PRN

## 2012-08-26 MED ORDER — LANOLIN HYDROUS EX OINT: 1.0000 "application " | TOPICAL_OINTMENT | CUTANEOUS | Status: DC | PRN

## 2012-08-26 MED ORDER — EPHEDRINE 5 MG/ML INJ
10.0000 mg | INTRAVENOUS | Status: DC | PRN
  Filled 2012-08-26: qty 2

## 2012-08-26 MED ORDER — OXYTOCIN 40 UNITS IN LACTATED RINGERS INFUSION - SIMPLE MED
1.0000 m[IU]/min | INTRAVENOUS | Status: DC
  Filled 2012-08-26: qty 1000

## 2012-08-26 MED ORDER — LIDOCAINE HCL (PF) 1 % IJ SOLN
INTRAMUSCULAR | Status: DC | PRN
  Administered 2012-08-26 (×2): 4 mL

## 2012-08-26 MED ORDER — SIMETHICONE 80 MG PO CHEW
80.0000 mg | CHEWABLE_TABLET | ORAL | Status: DC | PRN
  Administered 2012-08-27: 80 mg via ORAL

## 2012-08-26 MED ORDER — FLEET ENEMA 7-19 GM/118ML RE ENEM: 1.0000 | ENEMA | Freq: Once | RECTAL | Status: DC

## 2012-08-26 MED ORDER — MENTHOL 3 MG MT LOZG: 1.0000 | LOZENGE | OROMUCOSAL | Status: DC | PRN

## 2012-08-26 MED ORDER — DIPHENHYDRAMINE HCL 50 MG/ML IJ SOLN: 12.5000 mg | INTRAMUSCULAR | Status: DC | PRN

## 2012-08-26 MED ORDER — PRENATAL MULTIVITAMIN CH: 1.0000 | ORAL_TABLET | Freq: Every day | ORAL | Status: DC

## 2012-08-26 MED ORDER — LORATADINE 10 MG PO TABS
10.0000 mg | ORAL_TABLET | Freq: Every day | ORAL | Status: DC
  Administered 2012-08-27 – 2012-08-28 (×2): 10 mg via ORAL
  Filled 2012-08-26 (×4): qty 1

## 2012-08-26 MED ORDER — OXYTOCIN BOLUS FROM INFUSION: 500.0000 mL | INTRAVENOUS | Status: DC

## 2012-08-26 MED ORDER — PENICILLIN G POTASSIUM 5000000 UNITS IJ SOLR
5.0000 10*6.[IU] | Freq: Once | INTRAVENOUS | Status: AC
  Administered 2012-08-26: 5 10*6.[IU] via INTRAVENOUS
  Filled 2012-08-26: qty 5

## 2012-08-26 MED ORDER — ZOLPIDEM TARTRATE 5 MG PO TABS: 5.0000 mg | ORAL_TABLET | Freq: Every evening | ORAL | Status: DC | PRN

## 2012-08-26 MED ORDER — ZOLPIDEM TARTRATE 5 MG PO TABS
5.0000 mg | ORAL_TABLET | Freq: Every evening | ORAL | Status: DC | PRN
  Administered 2012-08-26: 5 mg via ORAL
  Filled 2012-08-26: qty 1

## 2012-08-26 MED ORDER — ACETAMINOPHEN 325 MG PO TABS
650.0000 mg | ORAL_TABLET | ORAL | Status: DC | PRN
  Administered 2012-08-26: 650 mg via ORAL
  Filled 2012-08-26: qty 2

## 2012-08-26 MED ORDER — LACTATED RINGERS IV SOLN
INTRAVENOUS | Status: DC
Start: 2012-08-26 — End: 2012-08-28

## 2012-08-26 MED ORDER — CITRIC ACID-SODIUM CITRATE 334-500 MG/5ML PO SOLN: 30.0000 mL | ORAL | Status: DC | PRN

## 2012-08-26 MED ORDER — ONDANSETRON HCL 4 MG PO TABS: 4.0000 mg | ORAL_TABLET | ORAL | Status: DC | PRN

## 2012-08-26 MED ORDER — TETANUS-DIPHTH-ACELL PERTUSSIS 5-2.5-18.5 LF-MCG/0.5 IM SUSP
0.5000 mL | Freq: Once | INTRAMUSCULAR | Status: AC
  Administered 2012-08-27: 0.5 mL via INTRAMUSCULAR

## 2012-08-26 MED ORDER — SIMETHICONE 80 MG PO CHEW
80.0000 mg | CHEWABLE_TABLET | Freq: Three times a day (TID) | ORAL | Status: DC
  Administered 2012-08-26 (×2): 80 mg via ORAL

## 2012-08-26 MED ORDER — MISOPROSTOL 50MCG HALF TABLET
50.0000 ug | ORAL_TABLET | ORAL | Status: DC | PRN
  Administered 2012-08-26: 50 ug via ORAL
  Filled 2012-08-26: qty 1

## 2012-08-26 MED ORDER — ONDANSETRON HCL 4 MG/2ML IJ SOLN: 4.0000 mg | INTRAMUSCULAR | Status: DC | PRN

## 2012-08-26 MED ORDER — LACTATED RINGERS IV SOLN
500.0000 mL | Freq: Once | INTRAVENOUS | Status: AC
  Administered 2012-08-26: 500 mL via INTRAVENOUS

## 2012-08-26 MED ORDER — IBUPROFEN 600 MG PO TABS: 600.0000 mg | ORAL_TABLET | Freq: Four times a day (QID) | ORAL | Status: DC | PRN

## 2012-08-26 MED ORDER — LIDOCAINE HCL (PF) 1 % IJ SOLN
30.0000 mL | INTRAMUSCULAR | Status: DC | PRN
  Filled 2012-08-26 (×2): qty 30

## 2012-08-26 MED ORDER — GUAIFENESIN ER 600 MG PO TB12
600.0000 mg | ORAL_TABLET | Freq: Two times a day (BID) | ORAL | Status: DC | PRN
  Filled 2012-08-26: qty 1

## 2012-08-26 MED ORDER — OXYTOCIN 40 UNITS IN LACTATED RINGERS INFUSION - SIMPLE MED
1.0000 m[IU]/min | INTRAVENOUS | Status: DC
  Administered 2012-08-26: 2 m[IU]/min via INTRAVENOUS

## 2012-08-26 MED ORDER — LACTATED RINGERS IV SOLN: 500.0000 mL | INTRAVENOUS | Status: DC | PRN

## 2012-08-26 MED ORDER — PHENYLEPHRINE 40 MCG/ML (10ML) SYRINGE FOR IV PUSH (FOR BLOOD PRESSURE SUPPORT)
80.0000 ug | PREFILLED_SYRINGE | INTRAVENOUS | Status: DC | PRN
  Filled 2012-08-26: qty 5
  Filled 2012-08-26: qty 2

## 2012-08-26 MED ORDER — OXYTOCIN 40 UNITS IN LACTATED RINGERS INFUSION - SIMPLE MED: 62.5000 mL/h | INTRAVENOUS | Status: AC

## 2012-08-26 MED ORDER — LACTATED RINGERS IV SOLN
INTRAVENOUS | Status: DC
  Administered 2012-08-26 (×2): via INTRAVENOUS

## 2012-08-26 MED ORDER — ONDANSETRON HCL 4 MG/2ML IJ SOLN: 4.0000 mg | Freq: Four times a day (QID) | INTRAMUSCULAR | Status: DC | PRN

## 2012-08-26 MED ORDER — MEASLES, MUMPS & RUBELLA VAC ~~LOC~~ INJ: 0.5000 mL | INJECTION | Freq: Once | SUBCUTANEOUS | Status: DC

## 2012-08-26 MED ORDER — PRENATAL MULTIVITAMIN CH
1.0000 | ORAL_TABLET | Freq: Every day | ORAL | Status: DC
  Administered 2012-08-27 – 2012-08-28 (×2): 1 via ORAL
  Filled 2012-08-26 (×2): qty 1

## 2012-08-26 MED ORDER — EPHEDRINE 5 MG/ML INJ
10.0000 mg | INTRAVENOUS | Status: DC | PRN
  Filled 2012-08-26: qty 4
  Filled 2012-08-26: qty 2

## 2012-08-26 MED ORDER — FENTANYL 2.5 MCG/ML BUPIVACAINE 1/10 % EPIDURAL INFUSION (WH - ANES)
14.0000 mL/h | INTRAMUSCULAR | Status: DC | PRN
  Filled 2012-08-26: qty 125

## 2012-08-26 MED ORDER — PENICILLIN G POTASSIUM 5000000 UNITS IJ SOLR
2.5000 10*6.[IU] | INTRAVENOUS | Status: DC
  Administered 2012-08-26: 2.5 10*6.[IU] via INTRAVENOUS
  Filled 2012-08-26 (×4): qty 2.5

## 2012-08-26 NOTE — H&P (Signed)
Attestation of Attending Supervision of Advanced Practitioner (CNM/NP): Evaluation and management procedures were performed by the Advanced Practitioner under my supervision and collaboration.  I have reviewed the Advanced Practitioner's note and chart, and I agree with the management and plan.  Eliza Green 08/26/2012 7:03 AM

## 2012-08-26 NOTE — Anesthesia Preprocedure Evaluation (Addendum)
Anesthesia Evaluation  Patient identified by MRN, date of birth, ID band Patient awake    Reviewed: Allergy & Precautions, H&P , Patient's Chart, lab work & pertinent test results  Airway Mallampati: III TM Distance: >3 FB Neck ROM: full    Dental no notable dental hx. (+) Teeth Intact   Pulmonary neg pulmonary ROS,  breath sounds clear to auscultation  Pulmonary exam normal       Cardiovascular negative cardio ROS  Rhythm:regular Rate:Normal     Neuro/Psych Seizures -,  PSYCHIATRIC DISORDERS Depression ADHD Bipolar Disorder Hx/o PTSD Hx/o Depression negative neurological ROS     GI/Hepatic Neg liver ROS, GERD-  Medicated and Controlled,  Endo/Other  Morbid obesity  Renal/GU Renal diseasenegative Renal ROSCongenital VUR Bladder dysfunction  VUR    Musculoskeletal   Abdominal Normal abdominal exam  (+)   Peds  Hematology negative hematology ROS (+)   Anesthesia Other Findings   Reproductive/Obstetrics (+) Pregnancy                          Anesthesia Physical Anesthesia Plan  ASA: II  Anesthesia Plan: Epidural   Post-op Pain Management:    Induction:   Airway Management Planned:   Additional Equipment:   Intra-op Plan:   Post-operative Plan:   Informed Consent: I have reviewed the patients History and Physical, chart, labs and discussed the procedure including the risks, benefits and alternatives for the proposed anesthesia with the patient or authorized representative who has indicated his/her understanding and acceptance.     Plan Discussed with: Anesthesiologist  Anesthesia Plan Comments:         Anesthesia Quick Evaluation

## 2012-08-26 NOTE — Anesthesia Procedure Notes (Signed)
Epidural Patient location during procedure: OB Start time: 08/26/2012 8:11 AM  Staffing Anesthesiologist: Shaune Malacara A. Performed by: anesthesiologist   Preanesthetic Checklist Completed: patient identified, site marked, surgical consent, pre-op evaluation, timeout performed, IV checked, risks and benefits discussed and monitors and equipment checked  Epidural Patient position: sitting Prep: site prepped and draped and DuraPrep Patient monitoring: continuous pulse ox and blood pressure Approach: midline Injection technique: LOR air  Needle:  Needle type: Tuohy  Needle gauge: 17 G Needle length: 9 cm and 9 Needle insertion depth: 6 cm Catheter type: closed end flexible Catheter size: 19 Gauge Catheter at skin depth: 11 cm Test dose: negative and Other  Assessment Events: blood not aspirated, injection not painful, no injection resistance, negative IV test and no paresthesia  Additional Notes Patient identified. Risks and benefits discussed including failed block, incomplete  Pain control, post dural puncture headache, nerve damage, paralysis, blood pressure Changes, nausea, vomiting, reactions to medications-both toxic and allergic and post Partum back pain. All questions were answered. Patient expressed understanding and wished to proceed. Sterile technique was used throughout procedure. Epidural site was Dressed with sterile barrier dressing. No paresthesias, signs of intravascular injection Or signs of intrathecal spread were encountered.  Patient was more comfortable after the epidural was dosed. Please see RN's note for documentation of vital signs and FHR which are stable.

## 2012-08-26 NOTE — Progress Notes (Signed)
   Brianna Graham is a 22 y.o. G3P1011 at [redacted]w[redacted]d  admitted for PROM  Subjective: Feeling mild contractions  Objective: BP 138/64  Pulse 86  Temp(Src) 98.2 F (36.8 C) (Oral)  Resp 18  Ht 5\' 5"  (1.651 m)  Wt 259 lb 8 oz (117.708 kg)  BMI 43.18 kg/m2  LMP 11/18/2011    FHT:  FHR: 140 bpm, variability: moderate,  accelerations:  Present,  decelerations:  Absent UC:   irregular, every 2-5 minutes SVE:   Dilation: 2.5 Effacement (%): 70 Station: -2 Exam by:: [employer]: Lab Results  Component Value Date   WBC 12.7* 08/26/2012   HGB 10.7* 08/26/2012   HCT 31.9* 08/26/2012   MCV 86.7 08/26/2012   PLT 200 08/26/2012    Assessment / Plan: PROM, ripening complete Will start Pitocin Labor: no Fetal Wellbeing:  Category I Pain Control:  Labor support without medications Anticipated MOD:  NSVD  CRESENZO-DISHMAN,Jill Stopka 08/26/2012, 6:14 AM

## 2012-08-26 NOTE — Progress Notes (Signed)
[place] CPS worker, Melissa McClary, called this CSW to inquire about the pt & discuss disposition of the infant. CPS worker is currently involved with the pt, as her other child in in foster care. Melissa came to meet with MOB to complete safety assessment & inform her that a petition would be filed. Safety assessment placed in infants shadow chart. According to CPS worker, the parents expected CPS involvement & are cooperative. CPS request that infant be held until Monday morning. CSW will ask weekend CSW to complete full assessment.      

## 2012-08-26 NOTE — Anesthesia Postprocedure Evaluation (Signed)
  Anesthesia Post-op Note  Patient: Brianna Graham  Procedure(s) Performed: * No procedures listed *  Patient Location: PACU and Mother/Baby  Anesthesia Type:Epidural  Level of Consciousness: awake, alert  and oriented  Airway and Oxygen Therapy: Patient Spontanous Breathing  Post-op Pain: none  Post-op Assessment: Post-op Vital signs reviewed, Patient's Cardiovascular Status Stable, No headache, No backache, No residual numbness and No residual motor weakness  Post-op Vital Signs: Reviewed and stable  Complications: No apparent anesthesia complications

## 2012-08-27 MED ORDER — PANTOPRAZOLE SODIUM 40 MG PO TBEC
40.0000 mg | DELAYED_RELEASE_TABLET | Freq: Every day | ORAL | Status: DC
  Administered 2012-08-27 – 2012-08-28 (×2): 40 mg via ORAL
  Filled 2012-08-27 (×3): qty 1

## 2012-08-27 NOTE — Progress Notes (Signed)
Post Partum Day #1 Subjective: no complaints and up ad lib; bottlefeeding; desires Nexplanon for contraception  Objective: Blood pressure 110/71, pulse 78, temperature 98.2 F (36.8 C), temperature source Oral, resp. rate 20, height 5\' 5"  (1.651 m), weight 259 lb 8 oz (117.708 kg), last menstrual period 11/18/2011, SpO2 96.00%, unknown if currently breastfeeding.  Physical Exam:  General: alert, cooperative and no distress Lochia: appropriate Uterine Fundus: firm DVT Evaluation: No evidence of DVT seen on physical exam.   Recent Labs  08/26/12 0120  HGB 10.7*  HCT 31.9*    Assessment/Plan: Plan for discharge tomorrow   LOS: 2 days   SHAW, KIMBERLY 08/27/2012, 8:40 AM

## 2012-08-28 MED ORDER — IBUPROFEN 600 MG PO TABS: 600.0000 mg | ORAL_TABLET | Freq: Four times a day (QID) | ORAL | Status: DC | PRN

## 2012-08-28 MED ORDER — PNEUMOCOCCAL VAC POLYVALENT 25 MCG/0.5ML IJ INJ
0.5000 mL | INJECTION | INTRAMUSCULAR | Status: AC
  Administered 2012-08-28: 0.5 mL via INTRAMUSCULAR
  Filled 2012-08-28 (×2): qty 0.5

## 2012-08-28 NOTE — Discharge Summary (Signed)
Attestation of Attending Supervision of Advanced Practitioner (CNM/NP): Evaluation and management procedures were performed by the Advanced Practitioner under my supervision and collaboration.  I have reviewed the Advanced Practitioner's note and chart, and I agree with the management and plan.  HARRAWAY-SMITH, Alano Blasco 8:49 AM

## 2012-08-28 NOTE — Clinical Social Work Note (Signed)
Late Entry  Clinical Social Work Department PSYCHOSOCIAL ASSESSMENT - MATERNAL/CHILD 08/27/2012  Patient:  Brianna Graham,Brianna Graham  Account Number:  [id number]  Admit Date:  08/25/2012  Childs Name:  Christian Gauge Maffei  Clinical Social Worker:  Constantina Laseter, LCSW   Date/Time:  08/27/2012 10:00 AM  Date Referred:  08/27/2012   Referral source  Physician     Referred reason  Substance Abuse  Other - See comment   Other referral source:   DSS involvement    I:  FAMILY / HOME ENVIRONMENT Child's legal guardian:  DSS  Guardian - Name Guardian - Age Guardian - Address  Melissa McClarey     Other household support members/support persons Name Relationship DOB  MOB reports having a 20 year old not currently in her custody     Other support:   MOB reports good family support    II  PSYCHOSOCIAL DATA Information Source:  Patient Interview  Financial and Community Resources Employment:   unemployed   Financial resources:  Medicaid If Medicaid - County:  [place] Other  WIC  Food Stamps   School / Grade:   Maternity Care Coordinator / Child Services Coordination / Early Interventions:  Cultural issues impacting care:    III  STRENGTHS Strengths  Adequate Resources  Home prepared for Child (including basic supplies)  Supportive family/friends   Strength comment:    IV  RISK FACTORS AND CURRENT PROBLEMS Current Problem:  YES   Risk Factor & Current Problem Patient Issue Family Issue Risk Factor / Current Problem Comment  Substance Abuse 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  Other child already in DSS custody   N N     V  SOCIAL WORK ASSESSMENT CSW spoke with MOB about DSS involvement.  MOB reports she is on a plan with her first child to earn custody back. MOB reports she is aware of positive drug screen results and DSS is aware as well.  MOB reports continued use and this is not why her other child was put in DSS custody. MOB would not elaborate on contract with DSS,  however reported she is taking parenting classes and drug classes with court follow-up in June.  MOB reports she is also looking into MH services as she feels she does not have any MH concerns at this time and was this documented. CSW discussed any emotional concerns and MOB reported there is none.  CSW discussed SW being involved with discharge process and complying with DSS.  MOB was appropriate and understanding of process.  CSW will continue to follow for DSS involvement.  Per SW note from Friday, discharge will looked into for infant by DSS on Monday 5/19.      VI SOCIAL WORK PLAN Social Work Plan  Psychosocial Support/Ongoing Assessment of Needs   Type of pt/family education:   If child protective services report - county:  [place] If child protective services report - date:  08/26/2012 Information/referral to community resources comment:   Other social work plan:    

## 2012-08-28 NOTE — Discharge Summary (Signed)
Obstetric Discharge Summary Reason for Admission: rupture of membranes Prenatal Procedures: none Intrapartum Procedures: spontaneous vaginal delivery, 1st degree laceration w/ repair Postpartum Procedures: none Complications-Operative and Postpartum: none Eating, drinking, voiding, ambulating well.  +flatus.  Lochia and pain wnl.  No complaints. Offered to room-in after d/c today d/t CPS coming for infant tomorrow. Pt desires to go home today to 'get things ready' for visitation tomorrow, wants to stay until later this afternoon so she has more time to spend w/ infant.   Hemoglobin  Date Value Range Status  08/26/2012 10.7* 12.0 - 15.0 g/dL Final     HCT  Date Value Range Status  08/26/2012 31.9* 36.0 - 46.0 % Final    Physical Exam:  General: alert, cooperative and no distress Lochia: appropriate Uterine Fundus: firm Incision: n/a DVT Evaluation: No evidence of DVT seen on physical exam. Negative Homan's sign. No cords or calf tenderness. No significant calf/ankle edema.  Discharge Diagnoses: Term Pregnancy-delivered  Discharge Information: Date: 08/28/2012 Activity: pelvic rest Diet: routine Medications: PNV and Ibuprofen Condition: stable Instructions: refer to practice specific booklet Discharge to: home Follow-up Information   Follow up with [facility]. (as scheduled in 4wks for nexplanon insertion, and 6wks for postpartum visit)    Contact information:   [address] [place] [address] [phone]      Newborn Data: Live born female  Birth Weight: 8 lb 2 oz (3685 g) APGAR: 9, 9  Newborn to stay as inpatient, CPS supposed to be coming tomorrow to get infant per mom d/t her not having custody of 1st child.  Marge Duncans 08/28/2012, 7:09 AM

## 2012-08-29 ENCOUNTER — Telehealth: Payer: Self-pay | Admitting: Women's Health

## 2012-08-29 DIAGNOSIS — F419 Anxiety disorder, unspecified: Secondary | ICD-10-CM

## 2012-08-29 MED ORDER — LORAZEPAM 0.5 MG PO TABS: 0.5000 mg | ORAL_TABLET | Freq: Three times a day (TID) | ORAL | Status: DC | PRN
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
## 2012-08-29 NOTE — Progress Notes (Signed)
Post discharge chart review completed.  

## 2012-08-31 ENCOUNTER — Encounter (HOSPITAL_COMMUNITY): Payer: Self-pay | Admitting: Emergency Medicine

## 2012-08-31 ENCOUNTER — Emergency Department (HOSPITAL_COMMUNITY)
Admission: EM | Admit: 2012-08-31 | Discharge: 2012-08-31 | Disposition: A | Discharge status: Home / Self Care | Payer: Medicaid Other | Attending: Emergency Medicine | Admitting: Emergency Medicine

## 2012-08-31 ENCOUNTER — Emergency Department (HOSPITAL_COMMUNITY): Payer: Medicaid Other

## 2012-08-31 ENCOUNTER — Inpatient Hospital Stay (HOSPITAL_COMMUNITY): Admission: RE | Admit: 2012-08-31 | Payer: Medicaid Other | Source: Ambulatory Visit

## 2012-08-31 DIAGNOSIS — Z8659 Personal history of other mental and behavioral disorders: Secondary | ICD-10-CM | POA: Insufficient documentation

## 2012-08-31 DIAGNOSIS — Z87718 Personal history of other specified (corrected) congenital malformations of genitourinary system: Secondary | ICD-10-CM | POA: Insufficient documentation

## 2012-08-31 DIAGNOSIS — M25551 Pain in right hip: Secondary | ICD-10-CM

## 2012-08-31 DIAGNOSIS — Z8669 Personal history of other diseases of the nervous system and sense organs: Secondary | ICD-10-CM | POA: Insufficient documentation

## 2012-08-31 DIAGNOSIS — M5430 Sciatica, unspecified side: Secondary | ICD-10-CM | POA: Insufficient documentation

## 2012-08-31 DIAGNOSIS — Z87448 Personal history of other diseases of urinary system: Secondary | ICD-10-CM | POA: Insufficient documentation

## 2012-08-31 DIAGNOSIS — M25559 Pain in unspecified hip: Secondary | ICD-10-CM | POA: Insufficient documentation

## 2012-08-31 DIAGNOSIS — N898 Other specified noninflammatory disorders of vagina: Secondary | ICD-10-CM | POA: Insufficient documentation

## 2012-08-31 DIAGNOSIS — IMO0002 Reserved for concepts with insufficient information to code with codable children: Secondary | ICD-10-CM | POA: Insufficient documentation

## 2012-08-31 DIAGNOSIS — Z87440 Personal history of urinary (tract) infections: Secondary | ICD-10-CM | POA: Insufficient documentation

## 2012-08-31 DIAGNOSIS — M549 Dorsalgia, unspecified: Secondary | ICD-10-CM | POA: Insufficient documentation

## 2012-08-31 DIAGNOSIS — Z8619 Personal history of other infectious and parasitic diseases: Secondary | ICD-10-CM | POA: Insufficient documentation

## 2012-08-31 DIAGNOSIS — Z79899 Other long term (current) drug therapy: Secondary | ICD-10-CM | POA: Insufficient documentation

## 2012-08-31 DIAGNOSIS — F17200 Nicotine dependence, unspecified, uncomplicated: Secondary | ICD-10-CM | POA: Insufficient documentation

## 2012-08-31 DIAGNOSIS — F319 Bipolar disorder, unspecified: Secondary | ICD-10-CM | POA: Insufficient documentation

## 2012-08-31 MED ORDER — KETOROLAC TROMETHAMINE 60 MG/2ML IM SOLN
60.0000 mg | Freq: Once | INTRAMUSCULAR | Status: AC
  Administered 2012-08-31: 60 mg via INTRAMUSCULAR
  Filled 2012-08-31: qty 2

## 2012-08-31 MED ORDER — HYDROCODONE-ACETAMINOPHEN 5-325 MG PO TABS: 1.0000 | ORAL_TABLET | ORAL | Status: DC | PRN

## 2012-08-31 NOTE — ED Notes (Signed)
Pt states has had increased pain in right hip/leg since having second child 08-26-2012. Pt states pain shooting up and hurts to walk"feels like leg is going to pop out of joint"

## 2012-09-03 NOTE — Addendum Note (Signed)
Addendum created 09/03/12 1749 by Sandrea Hughs., MD   Modules edited: Anesthesia Events

## 2012-09-04 NOTE — ED Provider Notes (Signed)
Medical screening examination/treatment/procedure(s) were performed by non-physician practitioner and as supervising physician I was immediately available for consultation/collaboration.   Jevonte Clanton, MD 09/04/12 1538 
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
## 2012-09-06 ENCOUNTER — Ambulatory Visit: Payer: Self-pay | Admitting: Women's Health

## 2012-09-12 ENCOUNTER — Ambulatory Visit (INDEPENDENT_AMBULATORY_CARE_PROVIDER_SITE_OTHER): Payer: Medicaid Other | Admitting: Women's Health

## 2012-09-12 ENCOUNTER — Ambulatory Visit: Payer: Self-pay | Admitting: Women's Health

## 2012-09-12 ENCOUNTER — Encounter: Payer: Self-pay | Admitting: Women's Health

## 2012-09-12 VITALS — BP 100/60 | Ht 65.0 in | Wt 235.5 lb

## 2012-09-12 DIAGNOSIS — O99345 Other mental disorders complicating the puerperium: Secondary | ICD-10-CM

## 2012-09-12 DIAGNOSIS — F419 Anxiety disorder, unspecified: Secondary | ICD-10-CM

## 2012-09-12 MED ORDER — HYDROXYZINE PAMOATE 25 MG PO CAPS: 25.0000 mg | ORAL_CAPSULE | Freq: Four times a day (QID) | ORAL | Status: DC | PRN

## 2012-09-12 NOTE — Progress Notes (Signed)
Brianna Graham is a 22 y.o. [phone] female at who presents ~ 2.5wks pp SVD w/ report of anxiety, not being able to sleep well, decreased appetite, crying all day and night.  Denies suicidal or homicidal ideation.  Infant was removed from her care while in hospital- pt also does not have custody of her other child. She reports that the children are in two separate homes and she gets visitation w/ each one day a week, and she does find joy being able to see her children. Pt was rx'd ativan 0.5mg  po q 8hr prn anxiety on 5/19 after she was d/c'd from hospital and called w/ reports of anxiety. She states 1 pill was not helping so she increased dosage herself to 2 pills as needed, w/ minimal relief in anxiety- she states she is out of ativan.  She requests xanax today.  She states she has been on zoloft and lexapro before, but is allergic to them and all antidepressants.  She was rx'd vicodin for hip pain that developed ~1wk prior to birth and has continued, states she is out of this and is requesting more. Has external hemorrhoid that is bothering her at times- no bleeding. She wants me to check her perineal sutures to make sure they are OK and have not popped. Has appt w/ [facility] in the next couple of weeks from referral that was made ~2wks ago. Also has appt for Nexplanon insertion in 1 wk.  Allergies  Allergen Reactions  . Codeine Anaphylaxis, Swelling and Other (See Comments)    Hives  . Abilify (Aripiprazole)     Pt states makes her jumpy, "out of it"  . Codeine Hives  . Keflex (Cephalexin) Hives  . Other     All Mood stabilizers. Patient can't tolerate, jittery, and anxiety.   . Wellbutrin (Bupropion)     Sedated, unaware of surroundings, not an allergy, moody  . Aripiprazole Anxiety   O:  Physical Exam  Constitutional: She is oriented to person, place, and time. She appears well-developed and well-nourished.  HENT:  Head: Normocephalic.  Genitourinary:  Normal lochia Perineal  sutures palpated, laceration approximated, and healing well  Small non-thrombosed external hemorrhoid   Neurological: She is alert and oriented to person, place, and time.  Skin: Skin is warm and dry.  Psychiatric: She has a normal mood and affect. Her behavior is normal. Judgment and thought content normal.   A:  Depression/Anxiety  ~2.5wks pp SVD  Doesn't have custody of children  Bilateral hip pain  External non-thrombosed hemorrhoid  P:  Rx Vistaril 25mg  po q 6hrs prn #30 w/ 0RF  Continue Anusol prn  Tylenol prn as directed, warm showers, heating pad to hips prn- if not improved 6wk pp- see ortho  F/U in 1wk as scheduled for Nexplanon insertion and to evaluate effectiveness of vistaril- no sex until nexplanon placed  Keep appt w/ [facility] as scheduled   Discussed w/ Bettye Boeck, NP  Marge Duncans  09/12/12

## 2012-09-12 NOTE — Patient Instructions (Signed)

## 2012-09-22 ENCOUNTER — Encounter: Payer: Self-pay | Admitting: Obstetrics & Gynecology

## 2012-09-28 ENCOUNTER — Ambulatory Visit (INDEPENDENT_AMBULATORY_CARE_PROVIDER_SITE_OTHER): Payer: Medicaid Other | Admitting: Obstetrics & Gynecology

## 2012-09-28 ENCOUNTER — Encounter: Payer: Self-pay | Admitting: Obstetrics & Gynecology

## 2012-09-28 VITALS — BP 100/70 | Wt 236.0 lb

## 2012-09-28 DIAGNOSIS — Z3202 Encounter for pregnancy test, result negative: Secondary | ICD-10-CM

## 2012-09-28 DIAGNOSIS — Z30017 Encounter for initial prescription of implantable subdermal contraceptive: Secondary | ICD-10-CM

## 2012-09-28 DIAGNOSIS — Z3200 Encounter for pregnancy test, result unknown: Secondary | ICD-10-CM

## 2012-09-28 LAB — POCT URINE PREGNANCY: Preg Test, Ur: NEGATIVE

## 2012-09-28 NOTE — Patient Instructions (Signed)

## 2012-09-28 NOTE — Progress Notes (Signed)
Patient ID: Brianna Graham, female   DOB: 10-24-1990, 22 y.o.   MRN: [id number] Patient requests a nexplanon to be placed. No sex in 4+  Months Pregnancy test negative  Left arm prepped and 1% lidocaine injected Nexplanon system placed without difficulty  Palpable  Post partum exam next week

## 2012-10-04 ENCOUNTER — Telehealth: Payer: Self-pay | Admitting: Advanced Practice Midwife

## 2012-10-04 NOTE — Telephone Encounter (Signed)
Left message. JSY 

## 2012-10-04 NOTE — Telephone Encounter (Signed)
Pt got Nexplanon placed last week and was wondering if she could take the tape off. Advised it was safe to remove tape. JSY

## 2012-10-06 ENCOUNTER — Ambulatory Visit: Payer: Self-pay | Admitting: Advanced Practice Midwife

## 2012-10-18 ENCOUNTER — Ambulatory Visit (INDEPENDENT_AMBULATORY_CARE_PROVIDER_SITE_OTHER): Payer: Medicaid Other | Admitting: Advanced Practice Midwife

## 2012-10-18 ENCOUNTER — Encounter: Payer: Self-pay | Admitting: Advanced Practice Midwife
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
## 2012-10-24 ENCOUNTER — Ambulatory Visit: Payer: Self-pay

## 2012-11-11 ENCOUNTER — Inpatient Hospital Stay: Admit: 2012-11-11 | Payer: Self-pay | Admitting: Orthopedic Surgery

## 2012-11-11 ENCOUNTER — Encounter (HOSPITAL_COMMUNITY): Payer: Self-pay | Admitting: Certified Registered"

## 2012-11-11 SURGERY — IRRIGATION AND DEBRIDEMENT EXTREMITY
Anesthesia: General | Laterality: Left

## 2012-11-27 ENCOUNTER — Emergency Department (HOSPITAL_COMMUNITY): Payer: Medicaid Other

## 2012-11-27 ENCOUNTER — Emergency Department (HOSPITAL_COMMUNITY)
Admission: EM | Admit: 2012-11-27 | Discharge: 2012-11-27 | Disposition: A | Discharge status: Home / Self Care | Payer: Medicaid Other | Attending: Emergency Medicine | Admitting: Emergency Medicine

## 2012-11-27 ENCOUNTER — Encounter (HOSPITAL_COMMUNITY): Payer: Self-pay | Admitting: Emergency Medicine

## 2012-11-27 DIAGNOSIS — S139XXA Sprain of joints and ligaments of unspecified parts of neck, initial encounter: Secondary | ICD-10-CM | POA: Insufficient documentation

## 2012-11-27 DIAGNOSIS — Z87448 Personal history of other diseases of urinary system: Secondary | ICD-10-CM | POA: Insufficient documentation

## 2012-11-27 DIAGNOSIS — Y9289 Other specified places as the place of occurrence of the external cause: Secondary | ICD-10-CM | POA: Insufficient documentation

## 2012-11-27 DIAGNOSIS — Z8669 Personal history of other diseases of the nervous system and sense organs: Secondary | ICD-10-CM | POA: Insufficient documentation

## 2012-11-27 DIAGNOSIS — Y9389 Activity, other specified: Secondary | ICD-10-CM | POA: Insufficient documentation

## 2012-11-27 DIAGNOSIS — Z87440 Personal history of urinary (tract) infections: Secondary | ICD-10-CM | POA: Insufficient documentation

## 2012-11-27 DIAGNOSIS — Z8619 Personal history of other infectious and parasitic diseases: Secondary | ICD-10-CM | POA: Insufficient documentation

## 2012-11-27 DIAGNOSIS — M545 Low back pain: Secondary | ICD-10-CM

## 2012-11-27 DIAGNOSIS — IMO0002 Reserved for concepts with insufficient information to code with codable children: Secondary | ICD-10-CM | POA: Insufficient documentation

## 2012-11-27 DIAGNOSIS — Z79899 Other long term (current) drug therapy: Secondary | ICD-10-CM | POA: Insufficient documentation

## 2012-11-27 DIAGNOSIS — W010XXA Fall on same level from slipping, tripping and stumbling without subsequent striking against object, initial encounter: Secondary | ICD-10-CM | POA: Insufficient documentation

## 2012-11-27 DIAGNOSIS — Z8659 Personal history of other mental and behavioral disorders: Secondary | ICD-10-CM | POA: Insufficient documentation

## 2012-11-27 DIAGNOSIS — S161XXA Strain of muscle, fascia and tendon at neck level, initial encounter: Secondary | ICD-10-CM

## 2012-11-27 DIAGNOSIS — F17200 Nicotine dependence, unspecified, uncomplicated: Secondary | ICD-10-CM | POA: Insufficient documentation

## 2012-11-27 LAB — POCT PREGNANCY, URINE: Preg Test, Ur: NEGATIVE

## 2012-11-27 MED ORDER — DIAZEPAM 5 MG PO TABS: 5.0000 mg | ORAL_TABLET | Freq: Two times a day (BID) | ORAL | Status: DC

## 2012-11-27 MED ORDER — IBUPROFEN 800 MG PO TABS: 800.0000 mg | ORAL_TABLET | Freq: Three times a day (TID) | ORAL | Status: DC

## 2012-11-27 MED ORDER — IBUPROFEN 800 MG PO TABS
800.0000 mg | ORAL_TABLET | Freq: Once | ORAL | Status: AC
  Administered 2012-11-27: 800 mg via ORAL
  Filled 2012-11-27: qty 1

## 2012-11-27 MED ORDER — DIAZEPAM 5 MG PO TABS
5.0000 mg | ORAL_TABLET | Freq: Once | ORAL | Status: AC
  Administered 2012-11-27: 5 mg via ORAL
  Filled 2012-11-27: qty 1
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
## 2012-11-27 NOTE — ED Notes (Signed)
c collar placed

## 2012-11-27 NOTE — ED Notes (Signed)
Pt states tripped and fell x 4 days ago. C/o pain from neck to lower back. Pt states has pain to back of head also but denies hitting head or LOC. Pain worse with neck movement.

## 2012-12-04 NOTE — ED Provider Notes (Signed)
Medical screening examination/treatment/procedure(s) were performed by non-physician practitioner and as supervising physician I was immediately available for consultation/collaboration.  Candela Krul, MD 12/04/12 2114 

## 2013-02-26 ENCOUNTER — Encounter (HOSPITAL_COMMUNITY): Payer: Self-pay | Admitting: Emergency Medicine

## 2013-02-26 ENCOUNTER — Emergency Department (HOSPITAL_COMMUNITY)
Admission: EM | Admit: 2013-02-26 | Discharge: 2013-02-26 | Disposition: A | Discharge status: Home / Self Care | Payer: Medicaid Other | Attending: Emergency Medicine | Admitting: Emergency Medicine

## 2013-02-26 DIAGNOSIS — F319 Bipolar disorder, unspecified: Secondary | ICD-10-CM | POA: Insufficient documentation

## 2013-02-26 DIAGNOSIS — N390 Urinary tract infection, site not specified: Secondary | ICD-10-CM | POA: Insufficient documentation

## 2013-02-26 DIAGNOSIS — IMO0002 Reserved for concepts with insufficient information to code with codable children: Secondary | ICD-10-CM | POA: Insufficient documentation

## 2013-02-26 DIAGNOSIS — Z8619 Personal history of other infectious and parasitic diseases: Secondary | ICD-10-CM | POA: Insufficient documentation

## 2013-02-26 DIAGNOSIS — Z87448 Personal history of other diseases of urinary system: Secondary | ICD-10-CM | POA: Insufficient documentation

## 2013-02-26 DIAGNOSIS — M5431 Sciatica, right side: Secondary | ICD-10-CM

## 2013-02-26 DIAGNOSIS — G40909 Epilepsy, unspecified, not intractable, without status epilepticus: Secondary | ICD-10-CM | POA: Insufficient documentation

## 2013-02-26 DIAGNOSIS — F17200 Nicotine dependence, unspecified, uncomplicated: Secondary | ICD-10-CM | POA: Insufficient documentation

## 2013-02-26 DIAGNOSIS — M5430 Sciatica, unspecified side: Secondary | ICD-10-CM | POA: Insufficient documentation

## 2013-02-26 DIAGNOSIS — F411 Generalized anxiety disorder: Secondary | ICD-10-CM | POA: Insufficient documentation

## 2013-02-26 DIAGNOSIS — R450 Nervousness: Secondary | ICD-10-CM | POA: Insufficient documentation

## 2013-02-26 DIAGNOSIS — Z79899 Other long term (current) drug therapy: Secondary | ICD-10-CM | POA: Insufficient documentation

## 2013-02-26 DIAGNOSIS — Z9889 Other specified postprocedural states: Secondary | ICD-10-CM | POA: Insufficient documentation

## 2013-02-26 LAB — URINALYSIS, ROUTINE W REFLEX MICROSCOPIC
Nitrite: POSITIVE — AB
Protein, ur: NEGATIVE mg/dL
Specific Gravity, Urine: 1.02 (ref 1.005–1.030)
Urobilinogen, UA: 0.2 mg/dL (ref 0.0–1.0)

## 2013-02-26 LAB — URINE MICROSCOPIC-ADD ON

## 2013-02-26 MED ORDER — CYCLOBENZAPRINE HCL 10 MG PO TABS: 10.0000 mg | ORAL_TABLET | Freq: Two times a day (BID) | ORAL | Status: DC | PRN

## 2013-02-26 MED ORDER — NAPROXEN 500 MG PO TABS: 500.0000 mg | ORAL_TABLET | Freq: Two times a day (BID) | ORAL | Status: DC

## 2013-02-26 MED ORDER — CYCLOBENZAPRINE HCL 10 MG PO TABS
10.0000 mg | ORAL_TABLET | Freq: Once | ORAL | Status: AC
  Administered 2013-02-26: 10 mg via ORAL
  Filled 2013-02-26: qty 1

## 2013-02-26 MED ORDER — IBUPROFEN 800 MG PO TABS
800.0000 mg | ORAL_TABLET | Freq: Once | ORAL | Status: AC
  Administered 2013-02-26: 800 mg via ORAL
  Filled 2013-02-26: qty 1

## 2013-02-26 MED ORDER — SULFAMETHOXAZOLE-TRIMETHOPRIM 800-160 MG PO TABS: 1.0000 | ORAL_TABLET | Freq: Two times a day (BID) | ORAL | Status: AC

## 2013-02-26 MED ORDER — OXYCODONE-ACETAMINOPHEN 5-325 MG PO TABS
1.0000 | ORAL_TABLET | Freq: Once | ORAL | Status: AC
  Administered 2013-02-26: 1 via ORAL
  Filled 2013-02-26: qty 1

## 2013-02-26 NOTE — ED Notes (Signed)
Pt c/o right side back pain that radiates down right leg and up to the back of her neck. Pain becomes worse with movement.
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
## 2013-02-27 NOTE — ED Provider Notes (Signed)
Medical screening examination/treatment/procedure(s) were performed by non-physician practitioner and as supervising physician I was immediately available for consultation/collaboration.  EKG Interpretation   None         Joya Gaskins, MD 02/27/13 [phone]

## 2013-02-28 LAB — URINE CULTURE

## 2013-03-01 NOTE — ED Notes (Signed)
Post ED Visit - Positive Culture Follow-up: Successful Patient Follow-Up  Culture assessed and recommendations reviewed by: []  Wes Kathryne Eriksson, Pharm.D., BCPS []  Celedonio Miyamoto, Pharm.D., BCPS [x]  Georgina Pillion, Pharm.D., BCPS []  [place], [address].D., BCPS, AAHIVP []  Estella Husk, Pharm.D., BCPS, AAHIVP [X]  Treated with Bactrim, organism resistant to prescribed antimicrobial  22 YOF who presented to the ED with R-sided back pain and was found to have sciatic pain -- treated with muscle relaxants and NSAIDs. The patient was also noted to have a UTI and was discharged home on Bactrim -- the cultures are resistant to this antibiotic.  New antibiotic prescription: D/c Bactrim and start Macrobid 100 mg bid x 7 days  ED Provider: Emilia Beck      Brianna Graham 03/01/2013, 5:30 PM

## 2013-03-01 NOTE — Progress Notes (Addendum)
ED Antimicrobial Stewardship Positive Culture Follow Up   Brianna Graham is an 22 y.o. female who presented to [facility] on 02/26/2013 with a chief complaint of back pain  Chief Complaint  Patient presents with  . Back Pain    Recent Results (from the past 720 hour(s))  URINE CULTURE     Status: None   Collection Time    02/26/13 12:21 PM      Result Value Range Status   Specimen Description URINE, CLEAN CATCH   Final   Special Requests NONE   Final   Culture  Setup Time     Final   Value: 02/26/2013 20:43     Performed at [facility]   Culture     Final   Value: >=100,000 COLONIES/mL ESCHERICHIA COLI     Performed at [facility]   Report Status 02/28/2013 FINAL   Final   Organism ID, Bacteria ESCHERICHIA COLI   Final    [x]  Treated with Bactrim, organism resistant to prescribed antimicrobial  22 YOF who presented to the ED with R-sided back pain and was found to have sciatic pain -- treated with muscle relaxants and NSAIDs. The patient was also noted to have a UTI and was discharged home on Bactrim -- the cultures are resistant to this antibiotic.  New antibiotic prescription: D/c Bactrim and start Macrobid 100 mg bid x 7 days  ED Provider: Standley Dakins 03/01/2013, 9:20 AM Infectious Diseases Pharmacist Phone# [phone]

## 2013-03-02 ENCOUNTER — Telehealth (HOSPITAL_COMMUNITY): Payer: Self-pay | Admitting: Emergency Medicine

## 2013-04-13 NOTE — ED Notes (Signed)
No response after 30 days  chart appended and sent to Medical Records 

## 2014-02-12 ENCOUNTER — Encounter (HOSPITAL_COMMUNITY): Payer: Self-pay | Admitting: Emergency Medicine

## 2014-03-15 ENCOUNTER — Ambulatory Visit (INDEPENDENT_AMBULATORY_CARE_PROVIDER_SITE_OTHER): Payer: Medicaid Other | Admitting: Adult Health

## 2014-03-15 ENCOUNTER — Other Ambulatory Visit (HOSPITAL_COMMUNITY)
Admission: RE | Admit: 2014-03-15 | Discharge: 2014-03-15 | Disposition: A | Discharge status: Home / Self Care | Payer: Medicaid Other | Source: Ambulatory Visit | Attending: Obstetrics and Gynecology | Admitting: Obstetrics and Gynecology

## 2014-03-15 ENCOUNTER — Encounter: Payer: Self-pay | Admitting: Adult Health

## 2014-03-15 VITALS — BP 120/60 | HR 80 | Ht 62.0 in | Wt 239.0 lb

## 2014-03-15 DIAGNOSIS — N390 Urinary tract infection, site not specified: Secondary | ICD-10-CM

## 2014-03-15 DIAGNOSIS — Z1389 Encounter for screening for other disorder: Secondary | ICD-10-CM

## 2014-03-15 DIAGNOSIS — Z01419 Encounter for gynecological examination (general) (routine) without abnormal findings: Secondary | ICD-10-CM | POA: Diagnosis present

## 2014-03-15 DIAGNOSIS — Z3202 Encounter for pregnancy test, result negative: Secondary | ICD-10-CM

## 2014-03-15 DIAGNOSIS — N926 Irregular menstruation, unspecified: Secondary | ICD-10-CM

## 2014-03-15 DIAGNOSIS — Z0000 Encounter for general adult medical examination without abnormal findings: Secondary | ICD-10-CM

## 2014-03-15 DIAGNOSIS — R350 Frequency of micturition: Secondary | ICD-10-CM

## 2014-03-15 DIAGNOSIS — Z975 Presence of (intrauterine) contraceptive device: Secondary | ICD-10-CM

## 2014-03-15 HISTORY — DX: Presence of (intrauterine) contraceptive device: Z97.5

## 2014-03-15 HISTORY — DX: Irregular menstruation, unspecified: N92.6

## 2014-03-15 LAB — POCT URINALYSIS DIPSTICK
GLUCOSE UA: NEGATIVE
Leukocytes, UA: NEGATIVE
Nitrite, UA: POSITIVE
Protein, UA: NEGATIVE
RBC UA: NEGATIVE

## 2014-03-15 LAB — POCT URINE PREGNANCY: PREG TEST UR: NEGATIVE

## 2014-03-15 MED ORDER — NITROFURANTOIN MONOHYD MACRO 100 MG PO CAPS: 100.0000 mg | ORAL_CAPSULE | Freq: Two times a day (BID) | ORAL | Status: DC

## 2014-03-15 MED ORDER — MEGESTROL ACETATE 40 MG PO TABS: ORAL_TABLET | ORAL | Status: AC

## 2014-03-15 NOTE — Progress Notes (Signed)
Patient ID: Brianna Graham, female   DOB: 02/24/1991, 23 y.o.   MRN: [id number] History of Present Illness: Brianna Graham is a 23 year old white female in for pap and physical.She has nexplanon and is having irregular bleeding.Was having period every 3 months or so and now every few weeks.Has urinary frequency and headaches and back hurts since she got epidural.She is cutting back on caffeine. She got the nexplanon 08/2012.  Current Medications, Allergies, Past Medical History, Past Surgical History, Family History and Social History were reviewed in [facility] electronic medical record.     Review of Systems:  Patient denies any blurred vision, shortness of breath, chest pain, abdominal pain, problems with bowel movements, urination, or intercourse.No joint pain, moods stable at present, see HPI for positives.    Physical Exam:BP 120/60 mmHg  Pulse 80  Ht 5\' 2"  (1.575 m)  Wt 239 lb (108.41 kg)  BMI 43.70 kg/m2  LMP 03/12/2014  Breastfeeding? No  urine + nitrates, UPT negative General:  Well developed, well nourished, no acute distress Skin:  Warm and dry Neck:  Midline trachea, normal thyroid Lungs; Clear to auscultation bilaterally Breast:  No dominant palpable mass, retraction, or nipple discharge Cardiovascular: Regular rate and rhythm Abdomen:  Soft, non tender, no hepatosplenomegaly, No CVAT Pelvic:  External genitalia is normal in appearance.  The vagina is normal in appearance.  The cervix is bulbous, has period like blood, pap performed.  Uterus is felt to be normal size, shape, and contour.  No  adnexal masses or tenderness noted. Extremities:  No swelling or varicosities noted Psych:  No mood changes, alert and cooperative,seems happy   Impression: Well woman gyn exam with pap Irregular bleeding Nexplanon in place Urinary frequency  UTI    Plan: Try excedrin migraine for headaches Rx macrobid 1 bid x 7 days Rx megace 40 mg #45 3 x 5 days then 2 x 5 days then 1 daily  with 1 refill UA C&S sent GC/CHL sent Increase fluids Review handout on UTI

## 2014-03-15 NOTE — Patient Instructions (Signed)
Take megace  Physical in 1 year Urinary Tract Infection Urinary tract infections (UTIs) can develop anywhere along your urinary tract. Your urinary tract is your body's drainage system for removing wastes and extra water. Your urinary tract includes two kidneys, two ureters, a bladder, and a urethra. Your kidneys are a pair of bean-shaped organs. Each kidney is about the size of your fist. They are located below your ribs, one on each side of your spine. CAUSES Infections are caused by microbes, which are microscopic organisms, including fungi, viruses, and bacteria. These organisms are so small that they can only be seen through a microscope. Bacteria are the microbes that most commonly cause UTIs. SYMPTOMS  Symptoms of UTIs may vary by age and gender of the patient and by the location of the infection. Symptoms in young women typically include a frequent and intense urge to urinate and a painful, burning feeling in the bladder or urethra during urination. Older women and men are more likely to be tired, shaky, and weak and have muscle aches and abdominal pain. A fever may mean the infection is in your kidneys. Other symptoms of a kidney infection include pain in your back or sides below the ribs, nausea, and vomiting. DIAGNOSIS To diagnose a UTI, your caregiver will ask you about your symptoms. Your caregiver also will ask to provide a urine sample. The urine sample will be tested for bacteria and white blood cells. White blood cells are made by your body to help fight infection. TREATMENT  Typically, UTIs can be treated with medication. Because most UTIs are caused by a bacterial infection, they usually can be treated with the use of antibiotics. The choice of antibiotic and length of treatment depend on your symptoms and the type of bacteria causing your infection. HOME CARE INSTRUCTIONS  If you were prescribed antibiotics, take them exactly as your caregiver instructs you. Finish the medication  even if you feel better after you have only taken some of the medication.  Drink enough water and fluids to keep your urine clear or pale yellow.  Avoid caffeine, tea, and carbonated beverages. They tend to irritate your bladder.  Empty your bladder often. Avoid holding urine for long periods of time.  Empty your bladder before and after sexual intercourse.  After a bowel movement, women should cleanse from front to back. Use each tissue only once. SEEK MEDICAL CARE IF:   You have back pain.  You develop a fever.  Your symptoms do not begin to resolve within 3 days. SEEK IMMEDIATE MEDICAL CARE IF:   You have severe back pain or lower abdominal pain.  You develop chills.  You have nausea or vomiting.  You have continued burning or discomfort with urination. MAKE SURE YOU:   Understand these instructions.  Will watch your condition.  Will get help right away if you are not doing well or get worse. Document Released: 01/07/2005 Document Revised: 09/29/2011 Document Reviewed: 05/08/2011 [facility] Patient Information 2015 [employer] [place]. This information is not intended to replace advice given to you by your health care provider. Make sure you discuss any questions you have with your health care provider.increase water

## 2014-03-16 LAB — URINALYSIS
BILIRUBIN URINE: NEGATIVE
GLUCOSE, UA: NEGATIVE mg/dL
KETONES UR: NEGATIVE mg/dL
NITRITE: POSITIVE — AB
PH: 7 (ref 5.0–8.0)
Protein, ur: NEGATIVE mg/dL
Specific Gravity, Urine: 1.016 (ref 1.005–1.030)
Urobilinogen, UA: 0.2 mg/dL (ref 0.0–1.0)

## 2014-03-16 LAB — GC/CHLAMYDIA PROBE AMP
CT Probe RNA: NEGATIVE
GC PROBE AMP APTIMA: NEGATIVE

## 2014-03-17 LAB — URINE CULTURE

## 2014-03-19 LAB — CYTOLOGY - PAP

## 2014-08-10 DIAGNOSIS — Z029 Encounter for administrative examinations, unspecified: Secondary | ICD-10-CM

## 2014-08-13 IMAGING — CR DG HIP COMPLETE 2+V*R*
3 series · 3 of 3 positions shown · non-contrast
Comparison: No priors.

CLINICAL DATA: Right-sided sciatica. Vaginal delivery 5 days ago.

RIGHT HIP - COMPLETE 2+ VIEW

[view not recorded (1 of 3)]
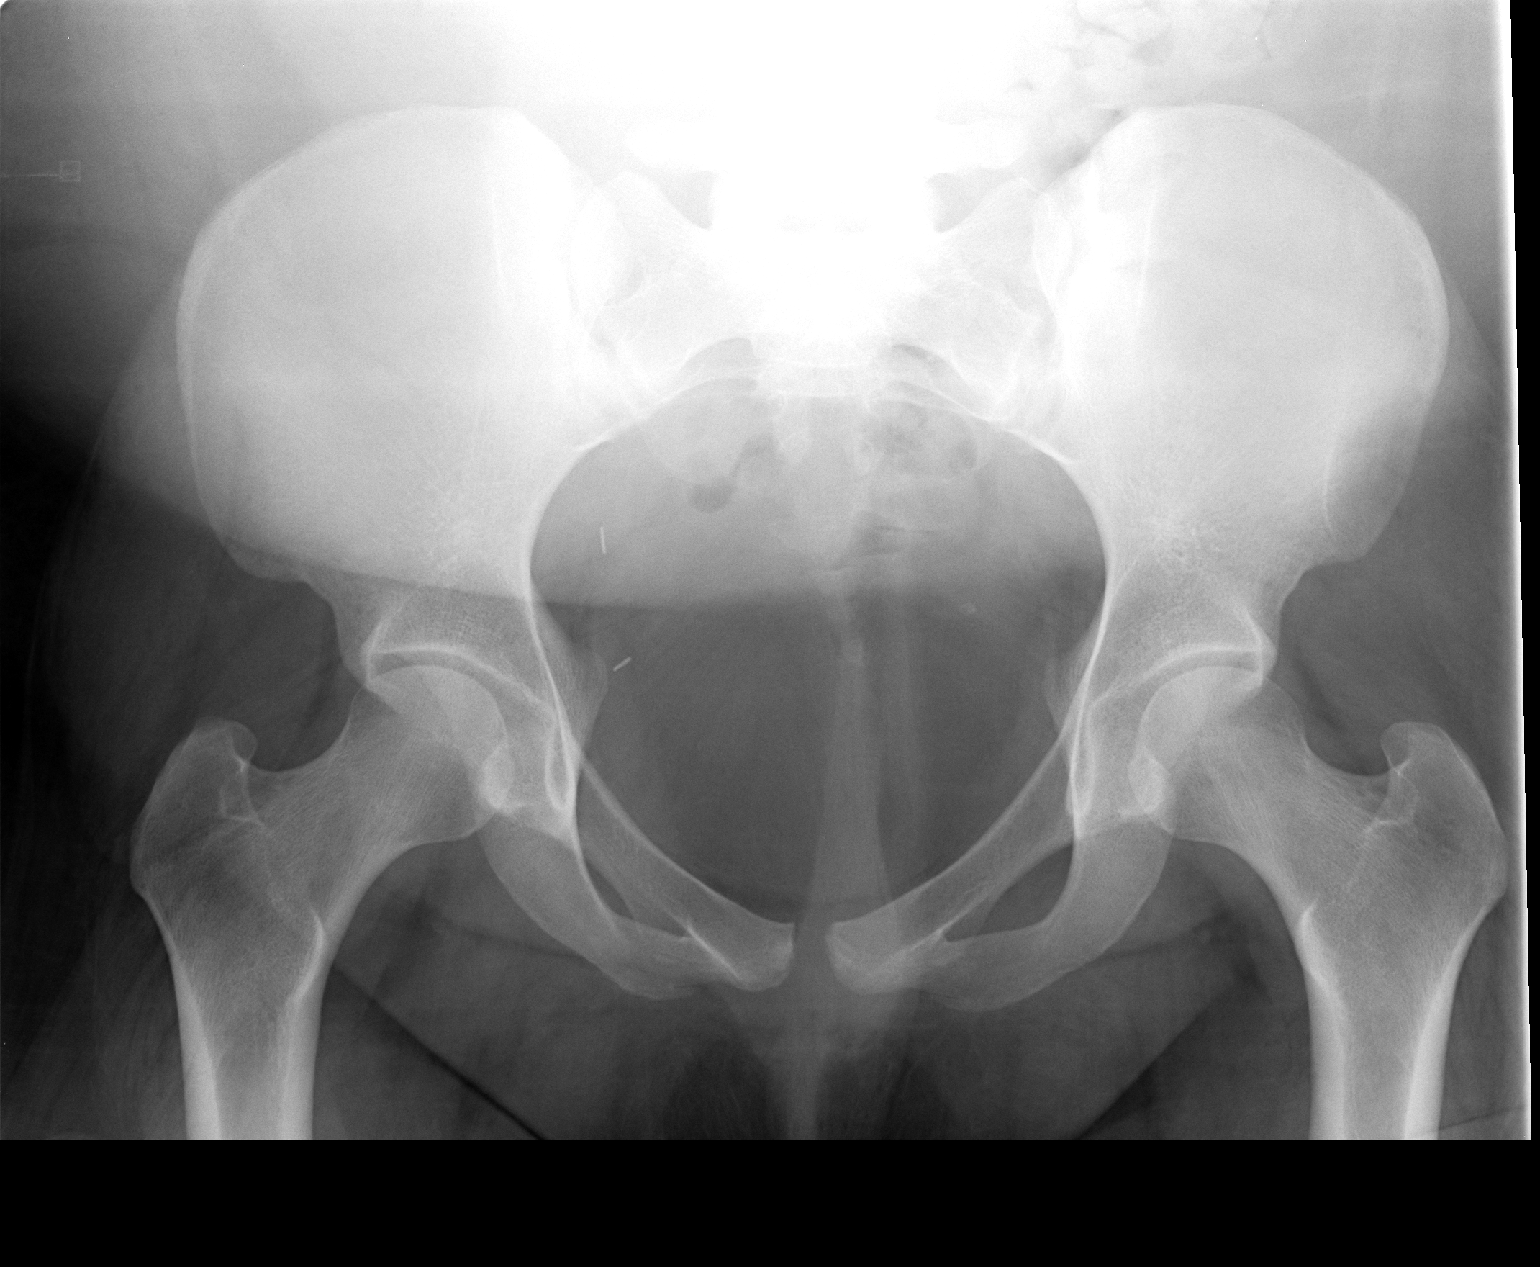

[view not recorded (2 of 3)]
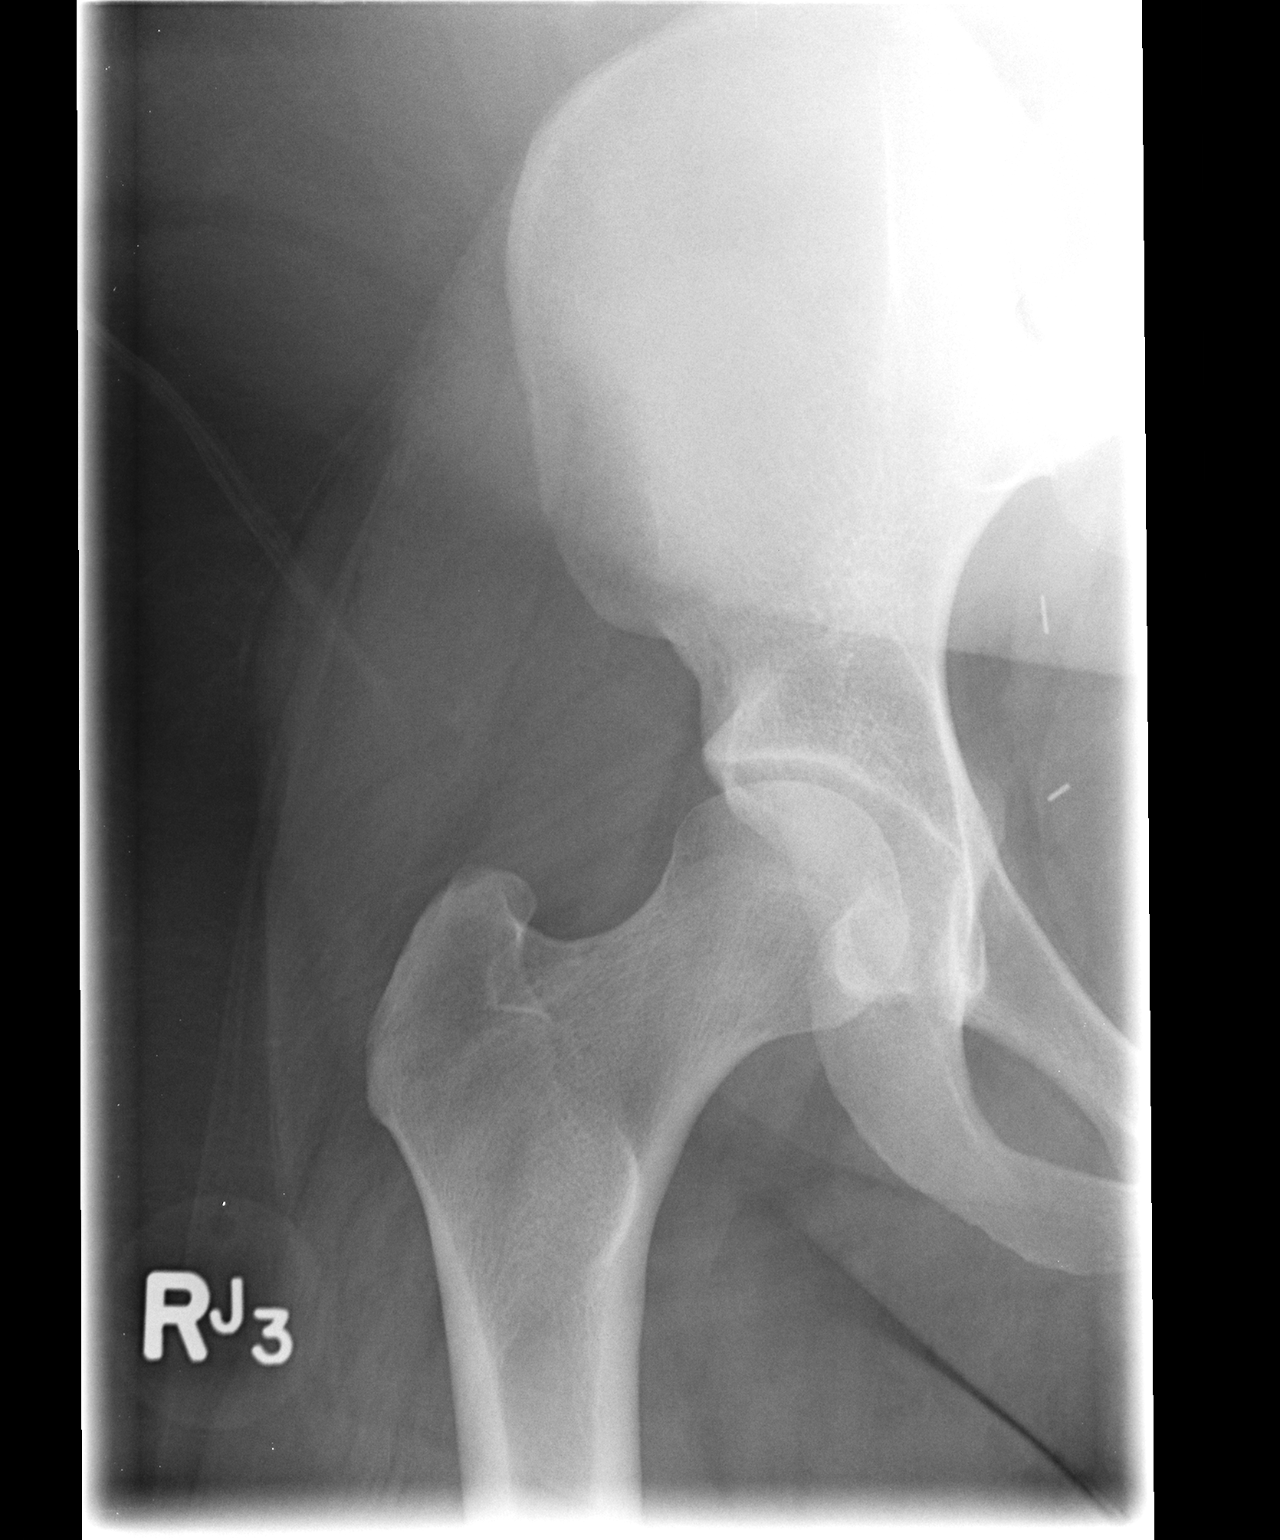

[view not recorded (3 of 3)]
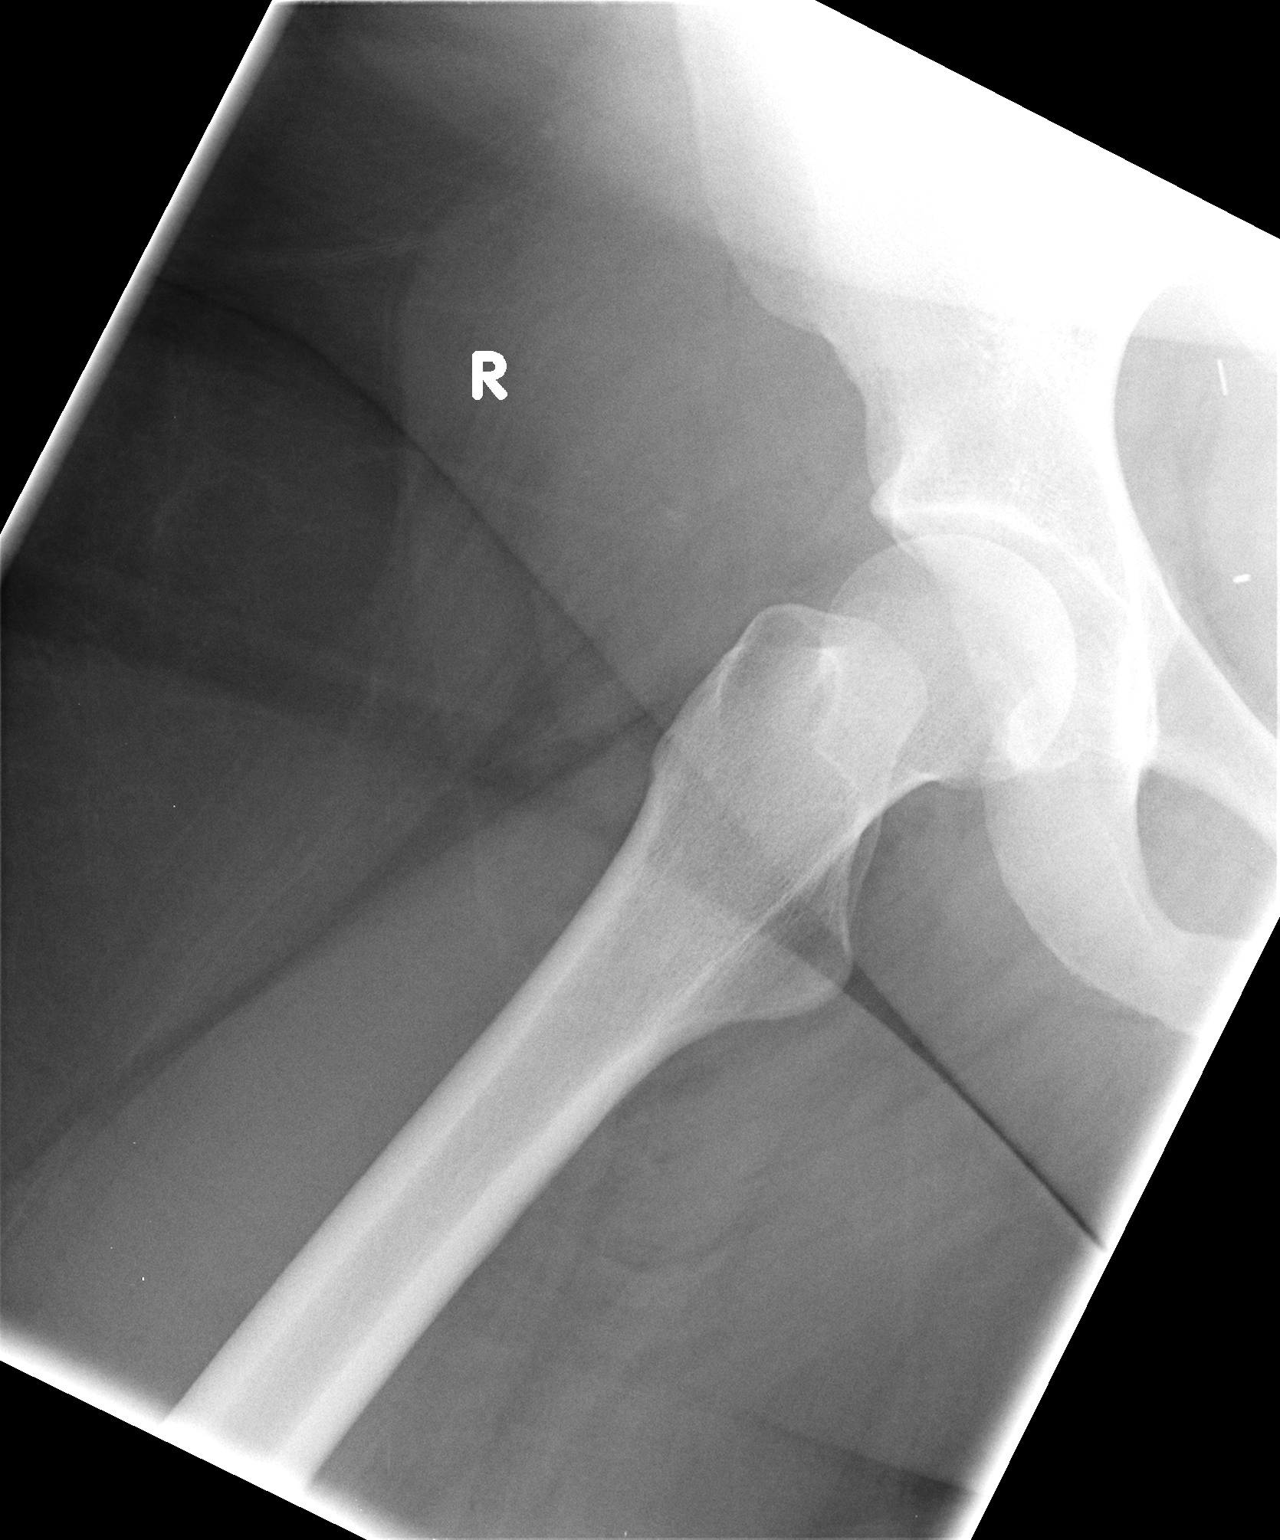

[3 of 3 positions shown; findings below may reference images not displayed]

FINDINGS: AP view of the bony pelvis and AP and lateral views of
the right hip demonstrate mild diastases of the symphysis pubis,
likely related to recent childbirth.  No acute displaced fracture,
subluxation or dislocation is noted.  Surgical clips are noted
along the right pelvic sidewall.
IMPRESSION: 1.  Mild diastases of the symphysis pubis.

## 2015-05-15 ENCOUNTER — Ambulatory Visit: Payer: Self-pay | Admitting: Obstetrics and Gynecology

## 2015-05-15 ENCOUNTER — Telehealth: Payer: Self-pay | Admitting: Obstetrics and Gynecology

## 2015-05-15 ENCOUNTER — Encounter: Payer: Self-pay | Admitting: *Deleted

## 2015-05-15 NOTE — Telephone Encounter (Signed)
Pt did not keep appt this am.  Unable to reach pt to determine etiiology of missed appt.

## 2015-10-02 ENCOUNTER — Encounter (INDEPENDENT_AMBULATORY_CARE_PROVIDER_SITE_OTHER): Payer: Medicaid Other | Admitting: Adult Health

## 2015-10-03 NOTE — Progress Notes (Signed)
This encounter was created in error - please disregard.

## 2015-10-22 ENCOUNTER — Encounter: Payer: Self-pay | Admitting: Obstetrics and Gynecology

## 2015-10-31 ENCOUNTER — Encounter: Payer: Self-pay | Admitting: Obstetrics and Gynecology

## 2015-10-31 ENCOUNTER — Ambulatory Visit (INDEPENDENT_AMBULATORY_CARE_PROVIDER_SITE_OTHER): Payer: Medicaid Other | Admitting: Obstetrics and Gynecology

## 2015-10-31 VITALS — Ht 65.0 in

## 2015-10-31 DIAGNOSIS — Z3202 Encounter for pregnancy test, result negative: Secondary | ICD-10-CM | POA: Diagnosis not present

## 2015-10-31 DIAGNOSIS — Z3200 Encounter for pregnancy test, result unknown: Secondary | ICD-10-CM

## 2015-10-31 DIAGNOSIS — Z3049 Encounter for surveillance of other contraceptives: Secondary | ICD-10-CM | POA: Diagnosis not present

## 2015-10-31 LAB — POCT URINE PREGNANCY: PREG TEST UR: NEGATIVE

## 2015-10-31 NOTE — Progress Notes (Signed)
Patient ID: Brianna Graham, female   DOB: 07-Sep-1990, 25 y.o.   MRN: [id number]  GYNECOLOGY CLINIC PROCEDURE NOTE  Brianna Graham is a 25 y.o. [id number] here for Nexplanon removal and Nexplanon insertion.  .  No other gynecologic concerns. This is her third      Nexplanon Removal and Insertion  Patient identified, informed consent performed, consent signed.   Patient does understand that irregular bleeding is a very common side effect of this medication. She was advised to have backup contraception for one week after replacement of the implant. Pregnancy test in clinic today was negative.  Appropriate time out taken. Implanon site identified. Area prepped in usual sterile fashon. One ml of 1% lidocaine was used to anesthetize the area at the distal end of the implant. A small stab incision was made right beside the implant on the distal portion. The Nexplanon rod was grasped using hemostats and removed without difficulty. There was minimal blood loss. There were no complications. Area was then injected with 3 ml of 1 % lidocaine. She was re-prepped with betadine, Nexplanon removed from packaging, Device confirmed in needle, then inserted full length of needle and withdrawn per handbook instructions. Nexplanon was able to palpated in the patient's arm; patient palpated the insert herself.  There was minimal blood loss. Patient insertion site covered with guaze and a pressure bandage to reduce any bruising. The patient tolerated the procedure well and was given post procedure instructions.  She was advised to have backup contraception for one week.

## 2015-11-04 ENCOUNTER — Telehealth: Payer: Self-pay | Admitting: Obstetrics and Gynecology

## 2015-11-04 NOTE — Telephone Encounter (Signed)
Pt states that her nexplanon site is bruised and warm to the touch. Pt states that she has noticed some bleeding from the site. Pt states that it feels like her nexplanon is coming out.   Pt states that she lives in [place] almost in [place] and it would take her a little while to get here. Pt was given an appointment tomorrow afternoon due to her request. Pt aware to be here at 1:15.

## 2015-11-05 ENCOUNTER — Ambulatory Visit (INDEPENDENT_AMBULATORY_CARE_PROVIDER_SITE_OTHER): Payer: Medicaid Other | Admitting: Obstetrics and Gynecology

## 2015-11-05 ENCOUNTER — Encounter: Payer: Self-pay | Admitting: Obstetrics and Gynecology

## 2015-11-05 DIAGNOSIS — Z3046 Encounter for surveillance of implantable subdermal contraceptive: Secondary | ICD-10-CM | POA: Diagnosis not present

## 2015-11-05 NOTE — Progress Notes (Signed)
Subjective:     Brianna Graham is a 25 y.o. female who presents to the clinic 1 weeks status post insertion of nexplanon for check of site, as she's finding it sore and bruised. ?heat? Noted by pt. Pt had extra local placed at insertion due to low pain tolerance. Removal and reinsertion done at that visit.. Diet:       regular without difficulty. Bowel function is: normal. Pain:     Pain is controlled with current analgesics. Medications being used: ibuprofen (OTC) and naproxen (OTC).    Review of Systems Constitutional: negative for fevers, sweats and weight loss    Objective:    BP 120/70 (BP Location: Right Arm, Patient Position: Sitting, Cuff Size: Large)   Ht 5\' 5"  (1.651 m)   Wt 117.9 kg (260 lb)   LMP 09/24/2015   BMI 43.27 kg/m  General:  alert and cooperative  Abdomen: soft, bowel sounds active, non-tender  Incision:   on arm" no erythema, no hernia, no seroma, no swelling, well approximated, some bruising, and nexplanon is very superficial, no dehiscence, incision well approximated       Pelvic: not done    Assessment:    Postoperative course complicated by discomfort due to bruising and superficial location of implant s/p more infiltration with local than is usual. Operative findings again reviewed. Pathology report discussed.    Plan:    1. Continue any current medications. 2. Wound care discussed. 3. Activity restrictions: none and pt to keep wide bandaid x 1 wk and rechk 4. Anticipated return to work: not applicable. 5. Follow up: 1 week for  rechk.

## 2015-11-12 ENCOUNTER — Encounter: Payer: Self-pay | Admitting: Obstetrics and Gynecology

## 2015-11-12 ENCOUNTER — Ambulatory Visit (INDEPENDENT_AMBULATORY_CARE_PROVIDER_SITE_OTHER): Payer: Medicaid Other | Admitting: Obstetrics and Gynecology

## 2015-11-12 VITALS — BP 120/80 | Ht 65.0 in | Wt 254.0 lb

## 2015-11-12 DIAGNOSIS — Z3046 Encounter for surveillance of implantable subdermal contraceptive: Secondary | ICD-10-CM

## 2015-11-12 DIAGNOSIS — M79622 Pain in left upper arm: Secondary | ICD-10-CM | POA: Diagnosis not present
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
## 2015-11-28 ENCOUNTER — Ambulatory Visit: Payer: Self-pay | Admitting: Obstetrics and Gynecology

## 2016-05-20 ENCOUNTER — Ambulatory Visit: Payer: Self-pay | Admitting: Obstetrics and Gynecology

## 2018-01-12 ENCOUNTER — Other Ambulatory Visit: Payer: Self-pay | Admitting: Advanced Practice Midwife

## 2018-11-15 ENCOUNTER — Encounter: Payer: Self-pay | Admitting: Obstetrics & Gynecology

## 2018-11-16 ENCOUNTER — Encounter: Payer: Self-pay | Admitting: Obstetrics and Gynecology

## 2018-11-17 ENCOUNTER — Encounter: Payer: Self-pay | Admitting: Women's Health

## 2018-12-07 ENCOUNTER — Encounter: Payer: Self-pay | Admitting: Student

## 2018-12-26 ENCOUNTER — Telehealth: Payer: Self-pay | Admitting: Women's Health

## 2018-12-26 NOTE — Telephone Encounter (Signed)
Tried to reach the patient to remind her of her appointment/restrictions, mailbox is full. 

## 2018-12-27 ENCOUNTER — Encounter: Payer: Self-pay | Admitting: Women's Health

## 2020-02-01 ENCOUNTER — Other Ambulatory Visit: Payer: Self-pay

## 2020-02-01 ENCOUNTER — Ambulatory Visit: Payer: Medicaid Other | Admitting: Orthopaedic Surgery

## 2022-06-13 DIAGNOSIS — R599 Enlarged lymph nodes, unspecified: Secondary | ICD-10-CM | POA: Diagnosis not present

## 2022-06-13 DIAGNOSIS — R591 Generalized enlarged lymph nodes: Secondary | ICD-10-CM | POA: Diagnosis not present

## 2022-06-13 DIAGNOSIS — F4310 Post-traumatic stress disorder, unspecified: Secondary | ICD-10-CM | POA: Diagnosis not present

## 2023-05-19 DIAGNOSIS — E559 Vitamin D deficiency, unspecified: Secondary | ICD-10-CM | POA: Diagnosis not present

## 2023-05-19 DIAGNOSIS — F410 Panic disorder [episodic paroxysmal anxiety] without agoraphobia: Secondary | ICD-10-CM | POA: Diagnosis not present

## 2023-05-19 DIAGNOSIS — E669 Obesity, unspecified: Secondary | ICD-10-CM | POA: Diagnosis not present

## 2023-05-19 DIAGNOSIS — Z79899 Other long term (current) drug therapy: Secondary | ICD-10-CM | POA: Diagnosis not present

## 2023-05-19 DIAGNOSIS — F418 Other specified anxiety disorders: Secondary | ICD-10-CM | POA: Diagnosis not present

## 2023-05-19 DIAGNOSIS — R202 Paresthesia of skin: Secondary | ICD-10-CM | POA: Diagnosis not present
